# Patient Record
Sex: Female | Born: 1980 | Race: White | Hispanic: No | Marital: Married | Smoking: Never smoker
Health system: Southern US, Community
[De-identification: ages and names within clinical notes are randomized; demographics above are authoritative.]

## PROBLEM LIST (undated history)

## (undated) DIAGNOSIS — E0590 Thyrotoxicosis, unspecified without thyrotoxic crisis or storm: Secondary | ICD-10-CM

## (undated) DIAGNOSIS — O99019 Anemia complicating pregnancy, unspecified trimester: Secondary | ICD-10-CM

## (undated) DIAGNOSIS — D62 Acute posthemorrhagic anemia: Secondary | ICD-10-CM

## (undated) DIAGNOSIS — D509 Iron deficiency anemia, unspecified: Secondary | ICD-10-CM

## (undated) DIAGNOSIS — F410 Panic disorder [episodic paroxysmal anxiety] without agoraphobia: Secondary | ICD-10-CM

## (undated) DIAGNOSIS — K8020 Calculus of gallbladder without cholecystitis without obstruction: Secondary | ICD-10-CM

## (undated) HISTORY — PX: WRIST SURGERY: SHX841

---

## 2013-02-21 ENCOUNTER — Encounter (HOSPITAL_COMMUNITY): Payer: Self-pay | Admitting: *Deleted

## 2013-02-21 ENCOUNTER — Emergency Department (HOSPITAL_COMMUNITY)
Admission: EM | Admit: 2013-02-21 | Discharge: 2013-02-21 | Disposition: A | Discharge status: Home / Self Care | Payer: BC Managed Care – PPO | Attending: Emergency Medicine | Admitting: Emergency Medicine

## 2013-02-21 ENCOUNTER — Emergency Department (HOSPITAL_COMMUNITY): Payer: BC Managed Care – PPO

## 2013-02-21 DIAGNOSIS — J029 Acute pharyngitis, unspecified: Secondary | ICD-10-CM | POA: Insufficient documentation

## 2013-02-21 DIAGNOSIS — R000 Tachycardia, unspecified: Secondary | ICD-10-CM | POA: Insufficient documentation

## 2013-02-21 DIAGNOSIS — R52 Pain, unspecified: Secondary | ICD-10-CM | POA: Insufficient documentation

## 2013-02-21 DIAGNOSIS — R6889 Other general symptoms and signs: Secondary | ICD-10-CM

## 2013-02-21 DIAGNOSIS — Z8659 Personal history of other mental and behavioral disorders: Secondary | ICD-10-CM | POA: Insufficient documentation

## 2013-02-21 DIAGNOSIS — R509 Fever, unspecified: Secondary | ICD-10-CM | POA: Insufficient documentation

## 2013-02-21 HISTORY — DX: Panic disorder (episodic paroxysmal anxiety): F41.0

## 2013-02-21 LAB — URINE MICROSCOPIC-ADD ON

## 2013-02-21 LAB — URINALYSIS, ROUTINE W REFLEX MICROSCOPIC
Glucose, UA: NEGATIVE mg/dL
Nitrite: NEGATIVE
Specific Gravity, Urine: 1.018 (ref 1.005–1.030)
pH: 7.5 (ref 5.0–8.0)

## 2013-02-21 LAB — BASIC METABOLIC PANEL
BUN: 12 mg/dL (ref 6–23)
CO2: 21 mEq/L (ref 19–32)
Chloride: 98 mEq/L (ref 96–112)
Creatinine, Ser: 0.55 mg/dL (ref 0.50–1.10)
GFR calc Af Amer: >90 mL/min (ref >90)
Potassium: 3.9 mEq/L (ref 3.5–5.1)

## 2013-02-21 LAB — CBC WITH DIFFERENTIAL/PLATELET
Basophils Absolute: 0 10*3/uL (ref 0.0–0.1)
Eosinophils Relative: 1 % (ref 0–5)
HCT: 35.5 % — ABNORMAL LOW (ref 36.0–46.0)
Lymphocytes Relative: 8 % — ABNORMAL LOW (ref 12–46)
Lymphs Abs: 1.1 10*3/uL (ref 0.7–4.0)
MCV: 84.9 fL (ref 78.0–100.0)
Monocytes Relative: 8 % (ref 3–12)
Neutro Abs: 12 10*3/uL — ABNORMAL HIGH (ref 1.7–7.7)
RBC: 4.18 MIL/uL (ref 3.87–5.11)
RDW: 13.2 % (ref 11.5–15.5)
WBC: 14.3 10*3/uL — ABNORMAL HIGH (ref 4.0–10.5)

## 2013-02-21 MED ORDER — ACETAMINOPHEN 325 MG PO TABS
650.0000 mg | ORAL_TABLET | Freq: Once | ORAL | Status: AC
  Administered 2013-02-21: 650 mg via ORAL
  Filled 2013-02-21: qty 2

## 2013-02-21 MED ORDER — SODIUM CHLORIDE 0.9 % IV BOLUS (SEPSIS)
1000.0000 mL | INTRAVENOUS | Status: AC
  Administered 2013-02-21: 1000 mL via INTRAVENOUS

## 2013-02-21 MED ORDER — OSELTAMIVIR PHOSPHATE 75 MG PO CAPS: 75.0000 mg | ORAL_CAPSULE | Freq: Two times a day (BID) | ORAL | Status: DC

## 2013-02-21 MED ORDER — SODIUM CHLORIDE 0.9 % IV BOLUS (SEPSIS)
1000.0000 mL | Freq: Once | INTRAVENOUS | Status: AC
  Administered 2013-02-21: 1000 mL via INTRAVENOUS

## 2013-02-21 NOTE — ED Provider Notes (Signed)
History     CSN: [id number]  Arrival date & time 02/21/13  0115   First MD Initiated Contact with Patient 02/21/13 0130      Chief Complaint  Patient presents with  . Panic Attack    (Consider location/radiation/quality/duration/timing/severity/associated sxs/prior treatment) HPI Comments: This is a 32 year old female, past medical history remarkable for panic attacks, who presents emergency department with chief complaint of generalized body aches. Patient states that she began feeling sick today. She endorses generalized body aches, and sore throat.  She rates her discomfort at an 8/10. She denies fever, chills, cough, chest pain, SOB, nausea, vomiting, diarrhea, constipation. She has not taken anything to alleviate her symptoms. She states that her pain is moderate in severity. She is wondering if this is a panic attack, she has a history of panic attacks. She has had panic attacks before and says that this feels similar.  The history is provided by the patient. No language interpreter was used.    Past Medical History  Diagnosis Date  . Panic attacks     History reviewed. No pertinent past surgical history.  No family history on file.  History  Substance Use Topics  . Smoking status: Never Smoker   . Smokeless tobacco: Not on file  . Alcohol Use: No    OB History   Grav Para Term Preterm Abortions TAB SAB Ect Mult Living                  Review of Systems  All other systems reviewed and are negative.    Allergies  Review of patient's allergies indicates no known allergies.  Home Medications  No current outpatient prescriptions on file.  BP 110/67  Pulse 114  Temp(Src) 99 F (37.2 C)  Resp 20  SpO2 98%  LMP 02/18/2013  Physical Exam  Nursing note and vitals reviewed. Constitutional: She is oriented to person, place, and time. She appears well-developed and well-nourished.  HENT:  Head: Normocephalic and atraumatic.  Eyes: Conjunctivae and EOM are  normal. Pupils are equal, round, and reactive to light.  Neck: Normal range of motion. Neck supple.  Cardiovascular: Regular rhythm.  Exam reveals no gallop and no friction rub.   No murmur heard. Tachycardic to the 110s  Pulmonary/Chest: Effort normal and breath sounds normal. No respiratory distress. She has no wheezes. She has no rales. She exhibits no tenderness.  Abdominal: Soft. Bowel sounds are normal. She exhibits no distension and no mass. There is no tenderness. There is no rebound and no guarding.  Musculoskeletal: Normal range of motion. She exhibits no edema and no tenderness.  Neurological: She is alert and oriented to person, place, and time.  Skin: Skin is warm and dry.  Psychiatric: She has a normal mood and affect. Her behavior is normal. Judgment and thought content normal.    ED Course  Procedures (including critical care time)  Labs Reviewed  CBC WITH DIFFERENTIAL  BASIC METABOLIC PANEL   Results for orders placed during the hospital encounter of 02/21/13  CBC WITH DIFFERENTIAL      Result Value Range   WBC 14.3 (*) 4.0 - 10.5 K/uL   RBC 4.18  3.87 - 5.11 MIL/uL   Hemoglobin 12.5  12.0 - 15.0 g/dL   HCT 40.9 (*) 81.1 - 91.4 %   MCV 84.9  78.0 - 100.0 fL   MCH 29.9  26.0 - 34.0 pg   MCHC 35.2  30.0 - 36.0 g/dL   RDW 78.2  95.6 -  15.5 %   Platelets PLATELETS APPEAR DECREASED  150 - 400 K/uL   Neutrophils Relative 83 (*) 43 - 77 %   Lymphocytes Relative 8 (*) 12 - 46 %   Monocytes Relative 8  3 - 12 %   Eosinophils Relative 1  0 - 5 %   Basophils Relative 0  0 - 1 %   Neutro Abs 12.0 (*) 1.7 - 7.7 K/uL   Lymphs Abs 1.1  0.7 - 4.0 K/uL   Monocytes Absolute 1.1 (*) 0.1 - 1.0 K/uL   Eosinophils Absolute 0.1  0.0 - 0.7 K/uL   Basophils Absolute 0.0  0.0 - 0.1 K/uL   Smear Review PLATELET COUNT CONFIRMED BY SMEAR    BASIC METABOLIC PANEL      Result Value Range   Sodium 132 (*) 135 - 145 mEq/L   Potassium 3.9  3.5 - 5.1 mEq/L   Chloride 98  96 - 112 mEq/L    CO2 21  19 - 32 mEq/L   Glucose, Bld 104 (*) 70 - 99 mg/dL   BUN 12  6 - 23 mg/dL   Creatinine, Ser 4.09  0.50 - 1.10 mg/dL   Calcium 8.7  8.4 - 81.1 mg/dL   GFR calc non Af Amer >90  >90 mL/min   GFR calc Af Amer >90  >90 mL/min  URINALYSIS, ROUTINE W REFLEX MICROSCOPIC      Result Value Range   Color, Urine YELLOW  YELLOW   APPearance CLEAR  CLEAR   Specific Gravity, Urine 1.018  1.005 - 1.030   pH 7.5  5.0 - 8.0   Glucose, UA NEGATIVE  NEGATIVE mg/dL   Hgb urine dipstick MODERATE (*) NEGATIVE   Bilirubin Urine NEGATIVE  NEGATIVE   Ketones, ur NEGATIVE  NEGATIVE mg/dL   Protein, ur NEGATIVE  NEGATIVE mg/dL   Urobilinogen, UA 0.2  0.0 - 1.0 mg/dL   Nitrite NEGATIVE  NEGATIVE   Leukocytes, UA SMALL (*) NEGATIVE  URINE MICROSCOPIC-ADD ON      Result Value Range   Squamous Epithelial / LPF RARE  RARE   WBC, UA 3-6  <3 WBC/hpf   RBC / HPF 7-10  <3 RBC/hpf   Bacteria, UA RARE  RARE   Dg Chest 2 View  02/21/2013  *RADIOLOGY REPORT*  Clinical Data: Sudden onset of chest pain, shortness of breath, and anxiety.  CHEST - 2 VIEW  Comparison: None.  Findings: The heart size and pulmonary vascularity are normal. The lungs appear clear and expanded without focal air space disease or consolidation. No blunting of the costophrenic angles.  No pneumothorax.  Mediastinal contours appear intact.  IMPRESSION: No evidence of active pulmonary disease.   Original Report Authenticated By: Burman Nieves, M.D.       1. Flu-like symptoms       MDM  32 year old female with flulike illness. Will give fluids. Doubt this panic attack. Will give Tylenol for fever. Will reevaluate and check basic labs.    No diaphoresis, or SOB. Doubt PE, no coughing.  Generalized body aches with low-grade fever.  Suspect viral illness, perhaps influenza.  Patient did not get the flu shot. Will culture urine.  Return precautions given. States that she still feels achy, but that it is 2/10 after fluids and tylenol.   She states that she wants to go home.  Will discharge, recommend continued fluids, and tylenol/motrin for fever.  Discussed the patient with Dr. Norlene Campbell, who agrees with the plan.  Patient is stable  and ready for discharge.  Filed Vitals:   02/21/13 0414  BP:   Pulse: 107  Temp: 98.2 F (36.8 C)  Resp: [address] PA-C 02/21/13 0445  Roxy Horseman, PA-C 02/21/13 [phone]

## 2013-02-21 NOTE — ED Notes (Signed)
Pt also states cold like symptoms, generalized body aches.

## 2013-02-21 NOTE — ED Notes (Signed)
Pt states has a lot of stress and feels like having a panic attack; states chest feels heavy; aching all over; abd pain

## 2013-02-21 NOTE — ED Provider Notes (Signed)
Medical screening examination/treatment/procedure(s) were performed by non-physician practitioner and as supervising physician I was immediately available for consultation/collaboration.  Rivaldo Hineman M Tajai Suder, MD 02/21/13 0517 

## 2013-02-22 LAB — URINE CULTURE

## 2013-06-06 ENCOUNTER — Ambulatory Visit: Payer: BC Managed Care – PPO | Admitting: Internal Medicine

## 2013-06-06 VITALS — BP 90/54 | HR 121 | Temp 102.8°F | Resp 17 | Ht 58.5 in | Wt 121.0 lb

## 2013-06-06 DIAGNOSIS — J020 Streptococcal pharyngitis: Secondary | ICD-10-CM

## 2013-06-06 DIAGNOSIS — R109 Unspecified abdominal pain: Secondary | ICD-10-CM

## 2013-06-06 DIAGNOSIS — J039 Acute tonsillitis, unspecified: Secondary | ICD-10-CM

## 2013-06-06 DIAGNOSIS — R05 Cough: Secondary | ICD-10-CM

## 2013-06-06 DIAGNOSIS — IMO0001 Reserved for inherently not codable concepts without codable children: Secondary | ICD-10-CM

## 2013-06-06 DIAGNOSIS — M791 Myalgia, unspecified site: Secondary | ICD-10-CM

## 2013-06-06 DIAGNOSIS — J029 Acute pharyngitis, unspecified: Secondary | ICD-10-CM

## 2013-06-06 LAB — POCT CBC
Lymph, poc: 0.9 (ref 0.6–3.4)
MCH, POC: 29.4 pg (ref 27–31.2)
MCHC: 31.6 g/dL — AB (ref 31.8–35.4)
MCV: 93 fL (ref 80–97)
MID (cbc): 0.5 (ref 0–0.9)
POC LYMPH PERCENT: 6.3 %L — AB (ref 10–50)
POC MID %: 3.6 %M (ref 0–12)
Platelet Count, POC: 236 10*3/uL (ref 142–424)
RBC: 4.39 M/uL (ref 4.04–5.48)
RDW, POC: 13.9 %
WBC: 14.7 10*3/uL — AB (ref 4.6–10.2)

## 2013-06-06 LAB — POCT UA - MICROSCOPIC ONLY
Casts, Ur, LPF, POC: NEGATIVE
Mucus, UA: NEGATIVE

## 2013-06-06 LAB — POCT URINALYSIS DIPSTICK
Bilirubin, UA: NEGATIVE
Ketones, UA: 80
Leukocytes, UA: NEGATIVE
Spec Grav, UA: 1.02
pH, UA: 5.5

## 2013-06-06 MED ORDER — AMOXICILLIN 500 MG PO CAPS: 1000.0000 mg | ORAL_CAPSULE | Freq: Two times a day (BID) | ORAL | Status: DC

## 2013-06-06 MED ORDER — HYDROCODONE-ACETAMINOPHEN 7.5-325 MG/15ML PO SOLN: 5.0000 mL | Freq: Four times a day (QID) | ORAL | Status: DC | PRN

## 2013-06-06 MED ORDER — CEFTRIAXONE SODIUM 1 G IJ SOLR
1.0000 g | Freq: Once | INTRAMUSCULAR | Status: AC
  Administered 2013-06-06: 1 g via INTRAMUSCULAR

## 2013-06-06 NOTE — Progress Notes (Signed)
  Subjective:    Patient ID: Marissa Shaffer, female    DOB: 02/25/1981, 32 y.o.   MRN: [id number]  HPI    Review of Systems     Objective:   Physical Exam        Assessment & Plan:

## 2013-06-06 NOTE — Progress Notes (Signed)
Subjective:    Patient ID: Marissa Shaffer, female    DOB: 08-Aug-1981, 32 y.o.   MRN: [id number]  HPI 32 y.o female present to clinic with cold/flu like symptoms. Has had stiff neck, sore throat. Patient states she feels achey all over. Aches worse in lower back Has had headache, fever, nausea vomiting. Has vomited twice. Pateint has been pushing fluids. Symptoms started Tuesday evening. Thinks she has a uti. Very sore throat. Body aches and vomiting.  Patient worked out with a Systems analyst on Tuesday, thinks this may have made symptoms worse.  Had fever of 102.8.  Heart beating fast.   Review of Systems     Objective:   Physical Exam  Vitals reviewed. Constitutional: She is oriented to person, place, and time. She appears well-developed and well-nourished. She appears distressed.  HENT:  Right Ear: External ear normal.  Left Ear: External ear normal.  Nose: Mucosal edema, rhinorrhea and sinus tenderness present.  Mouth/Throat: Uvula is midline. Oropharyngeal exudate present.  Eyes: EOM are normal. No scleral icterus.  Neck: Normal range of motion. Neck supple.  Cardiovascular: Regular rhythm.  Tachycardia present.   No murmur heard. Pulmonary/Chest: Effort normal and breath sounds normal. She exhibits tenderness.  Abdominal: Soft. Bowel sounds are normal. There is no hepatosplenomegaly. There is tenderness in the left upper quadrant. There is CVA tenderness. There is no tenderness at McBurney's point and negative Murphy's sign.  Neurological: She is alert and oriented to person, place, and time. She has normal reflexes. No cranial nerve deficit. She exhibits normal muscle tone. Coordination normal.  Psychiatric: She has a normal mood and affect. Her behavior is normal.   Results for orders placed in visit on 06/06/13  POCT CBC      Result Value Range   WBC 14.7 (*) 4.6 - 10.2 K/uL   Lymph, poc 0.9  0.6 - 3.4   POC LYMPH PERCENT 6.3 (*) 10 - 50 %L   MID (cbc) 0.5  0 - 0.9    POC MID % 3.6  0 - 12 %M   POC Granulocyte 13.2 (*) 2 - 6.9   Granulocyte percent 90.1 (*) 37 - 80 %G   RBC 4.39  4.04 - 5.48 M/uL   Hemoglobin 12.9  12.2 - 16.2 g/dL   HCT, POC 40.9  81.1 - 47.9 %   MCV 93.0  80 - 97 fL   MCH, POC 29.4  27 - 31.2 pg   MCHC 31.6 (*) 31.8 - 35.4 g/dL   RDW, POC 91.4     Platelet Count, POC 236  142 - 424 K/uL   MPV 9.0  0 - 99.8 fL  POCT INFLUENZA A/B      Result Value Range   Influenza A, POC Negative     Influenza B, POC Negative    POCT URINALYSIS DIPSTICK      Result Value Range   Color, UA yellow     Clarity, UA cloudy     Glucose, UA neg     Bilirubin, UA neg     Ketones, UA 80     Spec Grav, UA 1.020     Blood, UA large     pH, UA 5.5     Protein, UA trace     Urobilinogen, UA 0.2     Nitrite, UA neg     Leukocytes, UA Negative    POCT UA - MICROSCOPIC ONLY      Result Value Range   WBC, Ur, HPF,  POC 1-3     RBC, urine, microscopic 2-4     Bacteria, U Microscopic trace     Mucus, UA neg     Epithelial cells, urine per micros 3-8     Crystals, Ur, HPF, POC neg     Casts, Ur, LPF, POC neg     Yeast, UA neg    POCT RAPID STREP A (OFFICE)      Result Value Range   Rapid Strep A Screen Positive (*) Negative          Assessment & Plan:  Tonsillitis/Vomiting/Cough

## 2013-06-06 NOTE — Patient Instructions (Addendum)

## 2015-06-25 LAB — OB RESULTS CONSOLE ABO/RH: RH TYPE: POSITIVE

## 2015-06-25 LAB — OB RESULTS CONSOLE HIV ANTIBODY (ROUTINE TESTING): HIV: NONREACTIVE

## 2015-06-25 LAB — OB RESULTS CONSOLE HEPATITIS B SURFACE ANTIGEN: HEP B S AG: NEGATIVE

## 2015-06-25 LAB — OB RESULTS CONSOLE RPR: RPR: NONREACTIVE

## 2015-06-25 LAB — OB RESULTS CONSOLE RUBELLA ANTIBODY, IGM: RUBELLA: NON-IMMUNE/NOT IMMUNE

## 2015-06-25 LAB — OB RESULTS CONSOLE ANTIBODY SCREEN: Antibody Screen: NEGATIVE

## 2015-06-29 ENCOUNTER — Other Ambulatory Visit (HOSPITAL_COMMUNITY): Payer: Self-pay | Admitting: Certified Nurse Midwife

## 2015-06-29 ENCOUNTER — Other Ambulatory Visit: Payer: Self-pay | Admitting: Certified Nurse Midwife

## 2015-06-29 DIAGNOSIS — R748 Abnormal levels of other serum enzymes: Secondary | ICD-10-CM

## 2015-06-29 DIAGNOSIS — R112 Nausea with vomiting, unspecified: Secondary | ICD-10-CM

## 2015-07-01 ENCOUNTER — Ambulatory Visit (HOSPITAL_COMMUNITY): Payer: Self-pay

## 2015-07-01 ENCOUNTER — Ambulatory Visit (HOSPITAL_COMMUNITY)
Admission: RE | Admit: 2015-07-01 | Discharge: 2015-07-01 | Disposition: A | Discharge status: Home / Self Care | Payer: BLUE CROSS/BLUE SHIELD | Source: Ambulatory Visit | Attending: Certified Nurse Midwife | Admitting: Certified Nurse Midwife

## 2015-07-01 DIAGNOSIS — R748 Abnormal levels of other serum enzymes: Secondary | ICD-10-CM | POA: Insufficient documentation

## 2015-07-01 DIAGNOSIS — K8020 Calculus of gallbladder without cholecystitis without obstruction: Secondary | ICD-10-CM | POA: Diagnosis not present

## 2015-07-01 DIAGNOSIS — O9989 Other specified diseases and conditions complicating pregnancy, childbirth and the puerperium: Secondary | ICD-10-CM | POA: Diagnosis not present

## 2015-07-01 DIAGNOSIS — Z3A14 14 weeks gestation of pregnancy: Secondary | ICD-10-CM | POA: Insufficient documentation

## 2015-07-01 DIAGNOSIS — R112 Nausea with vomiting, unspecified: Secondary | ICD-10-CM

## 2015-07-02 LAB — OB RESULTS CONSOLE GC/CHLAMYDIA
Chlamydia: NEGATIVE
Gonorrhea: NEGATIVE

## 2015-08-13 ENCOUNTER — Other Ambulatory Visit: Payer: Self-pay | Admitting: Certified Nurse Midwife

## 2015-08-13 DIAGNOSIS — R7989 Other specified abnormal findings of blood chemistry: Secondary | ICD-10-CM

## 2015-08-14 ENCOUNTER — Ambulatory Visit
Admission: RE | Admit: 2015-08-14 | Discharge: 2015-08-14 | Disposition: A | Discharge status: Home / Self Care | Payer: BLUE CROSS/BLUE SHIELD | Source: Ambulatory Visit | Attending: Certified Nurse Midwife | Admitting: Certified Nurse Midwife

## 2015-08-14 DIAGNOSIS — R7989 Other specified abnormal findings of blood chemistry: Secondary | ICD-10-CM

## 2015-11-19 LAB — OB RESULTS CONSOLE GBS: STREP GROUP B AG: NEGATIVE

## 2015-12-06 NOTE — L&D Delivery Note (Signed)
Delivery Note At 6:43 PM a viable and healthy female was delivered via Vaginal, Spontaneous Delivery (Presentation: Left Occiput Anterior).  APGAR: 9, 9; weight  pending.   Placenta status: spontaneous, intact.  Cord: 3 vessels with the following complications: None.  Cord pH: na  Anesthesia: None  Episiotomy: None Lacerations: 1st degree Suture Repair: 3.0 vicryl rapide Est. Blood Loss (mL): 150  Mom to postpartum.  Baby to Couplet care / Skin to Skin.  Chandra Asher J 12/16/2015, 6:59 PM

## 2015-12-16 ENCOUNTER — Inpatient Hospital Stay (HOSPITAL_COMMUNITY)
Admission: AD | Admit: 2015-12-16 | Discharge: 2015-12-18 | DRG: 775 | Disposition: A | Discharge status: Home / Self Care | Payer: BLUE CROSS/BLUE SHIELD | Source: Ambulatory Visit | Attending: Obstetrics and Gynecology | Admitting: Obstetrics and Gynecology

## 2015-12-16 ENCOUNTER — Encounter (HOSPITAL_COMMUNITY): Payer: Self-pay | Admitting: *Deleted

## 2015-12-16 DIAGNOSIS — E0580 Other thyrotoxicosis without thyrotoxic crisis or storm: Secondary | ICD-10-CM | POA: Diagnosis present

## 2015-12-16 DIAGNOSIS — Z833 Family history of diabetes mellitus: Secondary | ICD-10-CM | POA: Diagnosis not present

## 2015-12-16 DIAGNOSIS — Z3A39 39 weeks gestation of pregnancy: Secondary | ICD-10-CM | POA: Diagnosis not present

## 2015-12-16 DIAGNOSIS — O9081 Anemia of the puerperium: Secondary | ICD-10-CM | POA: Diagnosis not present

## 2015-12-16 DIAGNOSIS — O99284 Endocrine, nutritional and metabolic diseases complicating childbirth: Secondary | ICD-10-CM | POA: Diagnosis present

## 2015-12-16 DIAGNOSIS — O99280 Endocrine, nutritional and metabolic diseases complicating pregnancy, unspecified trimester: Secondary | ICD-10-CM

## 2015-12-16 DIAGNOSIS — O99019 Anemia complicating pregnancy, unspecified trimester: Secondary | ICD-10-CM

## 2015-12-16 DIAGNOSIS — D509 Iron deficiency anemia, unspecified: Secondary | ICD-10-CM | POA: Diagnosis present

## 2015-12-16 DIAGNOSIS — M791 Myalgia: Secondary | ICD-10-CM | POA: Diagnosis not present

## 2015-12-16 DIAGNOSIS — IMO0001 Reserved for inherently not codable concepts without codable children: Secondary | ICD-10-CM

## 2015-12-16 DIAGNOSIS — D62 Acute posthemorrhagic anemia: Secondary | ICD-10-CM | POA: Diagnosis not present

## 2015-12-16 DIAGNOSIS — E0590 Thyrotoxicosis, unspecified without thyrotoxic crisis or storm: Secondary | ICD-10-CM | POA: Diagnosis present

## 2015-12-16 HISTORY — DX: Acute posthemorrhagic anemia: D62

## 2015-12-16 HISTORY — DX: Iron deficiency anemia, unspecified: D50.9

## 2015-12-16 HISTORY — DX: Calculus of gallbladder without cholecystitis without obstruction: K80.20

## 2015-12-16 HISTORY — DX: Anemia complicating pregnancy, unspecified trimester: O99.019

## 2015-12-16 HISTORY — DX: Thyrotoxicosis, unspecified without thyrotoxic crisis or storm: E05.90

## 2015-12-16 LAB — CBC
HCT: 29.8 % — ABNORMAL LOW (ref 36.0–46.0)
HEMOGLOBIN: 9.5 g/dL — AB (ref 12.0–15.0)
MCH: 24.9 pg — AB (ref 26.0–34.0)
MCHC: 31.9 g/dL (ref 30.0–36.0)
MCV: 78.2 fL (ref 78.0–100.0)
PLATELETS: 277 10*3/uL (ref 150–400)
RBC: 3.81 MIL/uL — ABNORMAL LOW (ref 3.87–5.11)
RDW: 16 % — ABNORMAL HIGH (ref 11.5–15.5)
WBC: 13.4 10*3/uL — ABNORMAL HIGH (ref 4.0–10.5)

## 2015-12-16 LAB — ABO/RH: ABO/RH(D): O POS

## 2015-12-16 LAB — TYPE AND SCREEN
ABO/RH(D): O POS
Antibody Screen: NEGATIVE

## 2015-12-16 MED ORDER — OXYTOCIN 10 UNIT/ML IJ SOLN
2.5000 [IU]/h | INTRAVENOUS | Status: DC
  Filled 2015-12-16: qty 4

## 2015-12-16 MED ORDER — IBUPROFEN 600 MG PO TABS
600.0000 mg | ORAL_TABLET | Freq: Four times a day (QID) | ORAL | Status: DC
  Administered 2015-12-16 – 2015-12-18 (×6): 600 mg via ORAL
  Filled 2015-12-16 (×6): qty 1

## 2015-12-16 MED ORDER — LANOLIN HYDROUS EX OINT: TOPICAL_OINTMENT | CUTANEOUS | Status: DC | PRN

## 2015-12-16 MED ORDER — METHYLERGONOVINE MALEATE 0.2 MG/ML IJ SOLN: 0.2000 mg | INTRAMUSCULAR | Status: DC | PRN

## 2015-12-16 MED ORDER — ACETAMINOPHEN 325 MG PO TABS: 650.0000 mg | ORAL_TABLET | ORAL | Status: DC | PRN

## 2015-12-16 MED ORDER — BUTORPHANOL TARTRATE 1 MG/ML IJ SOLN
1.0000 mg | INTRAMUSCULAR | Status: DC | PRN
  Administered 2015-12-16: 1 mg via INTRAVENOUS
  Filled 2015-12-16: qty 1

## 2015-12-16 MED ORDER — ZOLPIDEM TARTRATE 5 MG PO TABS: 5.0000 mg | ORAL_TABLET | Freq: Every evening | ORAL | Status: DC | PRN

## 2015-12-16 MED ORDER — ONDANSETRON HCL 4 MG PO TABS: 4.0000 mg | ORAL_TABLET | ORAL | Status: DC | PRN

## 2015-12-16 MED ORDER — BENZOCAINE-MENTHOL 20-0.5 % EX AERO: 1.0000 "application " | INHALATION_SPRAY | CUTANEOUS | Status: DC | PRN

## 2015-12-16 MED ORDER — METHYLERGONOVINE MALEATE 0.2 MG PO TABS: 0.2000 mg | ORAL_TABLET | ORAL | Status: DC | PRN

## 2015-12-16 MED ORDER — SENNOSIDES-DOCUSATE SODIUM 8.6-50 MG PO TABS
2.0000 | ORAL_TABLET | ORAL | Status: DC
  Administered 2015-12-17 (×2): 2 via ORAL
  Filled 2015-12-16 (×2): qty 2

## 2015-12-16 MED ORDER — PRENATAL MULTIVITAMIN CH
1.0000 | ORAL_TABLET | Freq: Every day | ORAL | Status: DC
  Administered 2015-12-17: 1 via ORAL
  Filled 2015-12-16: qty 1

## 2015-12-16 MED ORDER — OXYTOCIN BOLUS FROM INFUSION
500.0000 mL | INTRAVENOUS | Status: DC
Start: 2015-12-16 — End: 2015-12-16

## 2015-12-16 MED ORDER — CITRIC ACID-SODIUM CITRATE 334-500 MG/5ML PO SOLN: 30.0000 mL | ORAL | Status: DC | PRN

## 2015-12-16 MED ORDER — DIPHENHYDRAMINE HCL 25 MG PO CAPS: 25.0000 mg | ORAL_CAPSULE | Freq: Four times a day (QID) | ORAL | Status: DC | PRN

## 2015-12-16 MED ORDER — LIDOCAINE HCL (PF) 1 % IJ SOLN
30.0000 mL | INTRAMUSCULAR | Status: DC | PRN
  Filled 2015-12-16: qty 30

## 2015-12-16 MED ORDER — ONDANSETRON HCL 4 MG/2ML IJ SOLN: 4.0000 mg | INTRAMUSCULAR | Status: DC | PRN

## 2015-12-16 MED ORDER — LACTATED RINGERS IV SOLN: 500.0000 mL | INTRAVENOUS | Status: DC | PRN

## 2015-12-16 MED ORDER — ONDANSETRON HCL 4 MG/2ML IJ SOLN: 4.0000 mg | Freq: Four times a day (QID) | INTRAMUSCULAR | Status: DC | PRN

## 2015-12-16 MED ORDER — DIBUCAINE 1 % RE OINT: 1.0000 "application " | TOPICAL_OINTMENT | RECTAL | Status: DC | PRN

## 2015-12-16 MED ORDER — TETANUS-DIPHTH-ACELL PERTUSSIS 5-2.5-18.5 LF-MCG/0.5 IM SUSP
0.5000 mL | Freq: Once | INTRAMUSCULAR | Status: AC
  Administered 2015-12-18: 0.5 mL via INTRAMUSCULAR

## 2015-12-16 MED ORDER — WITCH HAZEL-GLYCERIN EX PADS: 1.0000 "application " | MEDICATED_PAD | CUTANEOUS | Status: DC | PRN

## 2015-12-16 MED ORDER — ACETAMINOPHEN 325 MG PO TABS
650.0000 mg | ORAL_TABLET | ORAL | Status: DC | PRN
  Administered 2015-12-17: 650 mg via ORAL
  Filled 2015-12-16 (×2): qty 2

## 2015-12-16 MED ORDER — SIMETHICONE 80 MG PO CHEW: 80.0000 mg | CHEWABLE_TABLET | ORAL | Status: DC | PRN

## 2015-12-16 MED ORDER — OXYCODONE-ACETAMINOPHEN 5-325 MG PO TABS: 2.0000 | ORAL_TABLET | ORAL | Status: DC | PRN

## 2015-12-16 MED ORDER — OXYCODONE-ACETAMINOPHEN 5-325 MG PO TABS
1.0000 | ORAL_TABLET | ORAL | Status: DC | PRN
Start: 2015-12-16 — End: 2015-12-16

## 2015-12-16 MED ORDER — LACTATED RINGERS IV SOLN
INTRAVENOUS | Status: DC
  Administered 2015-12-16: 18:00:00 via INTRAVENOUS

## 2015-12-16 NOTE — H&P (Signed)
Marissa Shaffer is a 35 y.o. female presenting for contractions.  Maternal Medical History:  Reason for admission: Contractions.   Contractions: Onset was less than 1 hour ago.   Frequency: regular.   Perceived severity is moderate.    Fetal activity: Perceived fetal activity is normal.   Last perceived fetal movement was within the past hour.    Prenatal complications: no prenatal complications Prenatal Complications - Diabetes: none.    OB History    Gravida Para Term Preterm AB TAB SAB Ectopic Multiple Living   2 1        1      Past Medical History  Diagnosis Date  . Panic attacks   . Gallstones   . Hyperthyroidism    Past Surgical History  Procedure Laterality Date  . Wrist surgery     Family History: family history includes COPD in her mother; Cancer in her mother; Diabetes in her mother; Kidney disease in her father. Social History:  reports that she has never smoked. She has never used smokeless tobacco. She reports that she does not drink alcohol. Her drug history is not on file.   Prenatal Transfer Tool  Maternal Diabetes: No Genetic Screening: Normal Maternal Ultrasounds/Referrals: Normal Fetal Ultrasounds or other Referrals:  None Maternal Substance Abuse:  No Significant Maternal Medications:  None Significant Maternal Lab Results:  None Other Comments:  subclinical hyperthyroidism  Review of Systems  Constitutional: Negative.   All other systems reviewed and are negative.   Dilation: 6 Effacement (%): 100 Station: 0 Exam by:: [email or web address] Blood pressure 116/67, pulse 86, temperature 98 F (36.7 C), temperature source Oral, resp. rate 16, height 4\' 9"  (1.448 m), weight 58.968 kg (130 lb). Maternal Exam:  Uterine Assessment: Contraction strength is moderate.  Contraction frequency is regular.   Abdomen: Patient reports no abdominal tenderness. Fetal presentation: vertex  Introitus: Normal vulva. Normal vagina.  Ferning test: positive.   Nitrazine test: positive. Amniotic fluid character: meconium stained.  Pelvis: adequate for delivery.   Cervix: Cervix evaluated by digital exam.     Physical Exam  Nursing note and vitals reviewed. Constitutional: She is oriented to person, place, and time. She appears well-developed and well-nourished.  HENT:  Head: Normocephalic and atraumatic.  Neck: Normal range of motion. Neck supple.  Cardiovascular: Normal rate and regular rhythm.   Respiratory: Effort normal and breath sounds normal.  GI: Soft. Bowel sounds are normal.  Genitourinary: Vagina normal and uterus normal.  Musculoskeletal: Normal range of motion.  Neurological: She is alert and oriented to person, place, and time. She has normal reflexes.  Skin: Skin is warm and dry. No erythema.  Psychiatric: She has a normal mood and affect. Her behavior is normal.    Prenatal labs: ABO, Rh: O/Positive/-- (07/21 0000) Antibody: Negative (07/21 0000) Rubella: Nonimmune (07/21 0000) RPR: Nonreactive (07/21 0000)  HBsAg: Negative (07/21 0000)  HIV: Non-reactive (07/21 0000)  GBS: Negative (12/15 0000)   Assessment/Plan: Term IUP  Active labor Subclinical hyperthyroidism Admit IV pain meds   Marissa Shaffer J 12/16/2015, 6:18 PM

## 2015-12-16 NOTE — MAU Note (Signed)
Was checked earlier today, was 3 cm.  Had some bloody  Show. Ctx's are ~ every 5 min

## 2015-12-17 ENCOUNTER — Encounter (HOSPITAL_COMMUNITY): Payer: Self-pay | Admitting: *Deleted

## 2015-12-17 LAB — CBC
HCT: 25.8 % — ABNORMAL LOW (ref 36.0–46.0)
HEMOGLOBIN: 8.2 g/dL — AB (ref 12.0–15.0)
MCH: 24.8 pg — AB (ref 26.0–34.0)
MCHC: 31.8 g/dL (ref 30.0–36.0)
MCV: 77.9 fL — ABNORMAL LOW (ref 78.0–100.0)
PLATELETS: 278 10*3/uL (ref 150–400)
RBC: 3.31 MIL/uL — ABNORMAL LOW (ref 3.87–5.11)
RDW: 16.2 % — AB (ref 11.5–15.5)
WBC: 17.4 10*3/uL — ABNORMAL HIGH (ref 4.0–10.5)

## 2015-12-17 LAB — RPR: RPR Ser Ql: NONREACTIVE

## 2015-12-17 MED ORDER — OXYCODONE-ACETAMINOPHEN 5-325 MG PO TABS
1.0000 | ORAL_TABLET | ORAL | Status: DC | PRN
  Administered 2015-12-17 – 2015-12-18 (×4): 2 via ORAL
  Administered 2015-12-18: 1 via ORAL
  Filled 2015-12-17 (×4): qty 2
  Filled 2015-12-17: qty 1

## 2015-12-17 MED ORDER — POLYSACCHARIDE IRON COMPLEX 150 MG PO CAPS
150.0000 mg | ORAL_CAPSULE | Freq: Two times a day (BID) | ORAL | Status: DC
  Administered 2015-12-17 – 2015-12-18 (×3): 150 mg via ORAL
  Filled 2015-12-17 (×3): qty 1

## 2015-12-17 MED ORDER — CALCIUM CARBONATE ANTACID 500 MG PO CHEW
2.0000 | CHEWABLE_TABLET | Freq: Four times a day (QID) | ORAL | Status: DC | PRN
  Administered 2015-12-17: 400 mg via ORAL
  Filled 2015-12-17: qty 2

## 2015-12-17 MED ORDER — MAGNESIUM OXIDE 400 (241.3 MG) MG PO TABS
200.0000 mg | ORAL_TABLET | Freq: Every day | ORAL | Status: DC
  Administered 2015-12-17 – 2015-12-18 (×2): 200 mg via ORAL
  Filled 2015-12-17 (×2): qty 0.5

## 2015-12-17 NOTE — Progress Notes (Signed)
Patient complains of indigestion. CNM Fredric MareBailey notified, patient is requesting TUMS.

## 2015-12-17 NOTE — Lactation Note (Signed)
This note was copied from the chart of Marissa Shaffer. Lactation Consultation Note Experienced BF mom Bf her 1st child exclusively for 9 months then 3 more months for comfort. Has been 6 yrs ago. Has very large pendulum breast. Nipples appear flat, then w/stimulation everts out well. Nipple and areola very compressible for a deep latch. Mom states baby gets on then clamps down. Instructed to do gentle chin tug. Discussed positioning and obtaining deep latch. Mom has good flow of colostrum. Hand expression taught to Mom. Doing STS. Discussed pumping w/mom to obtain a supply. Discussed pumps and hand expression. Mom encouraged to feed baby 8-12 times/24 hours and with feeding cues. Referred to Baby and Me Book in Breastfeeding section Pg. 22-23 for position options and Proper latch demonstration. Educated about newborn behavior, I&O, cluster feeding, supply and demand. WH/LC brochure given w/resources, support groups and LC services. Patient Name: Marissa Shaffer [id number]'s Date: 12/17/2015 Reason for consult: Initial assessment   Maternal Data Has patient been taught Hand Expression?: Yes Does the patient have breastfeeding experience prior to this delivery?: Yes  Feeding Feeding Type: Breast Fed Length of feed: 20 min  LATCH Score/Interventions Latch: Repeated attempts needed to sustain latch, nipple held in mouth throughout feeding, stimulation needed to elicit sucking reflex.     Type of Nipple: Everted at rest and after stimulation  Comfort (Breast/Nipple): Soft / non-tender           Lactation Tools Discussed/Used     Consult Status Consult Status: Follow-up Date: 12/18/15 Follow-up type: In-patient    Hendrix Console, Diamond NickelLAURA G 12/17/2015, 5:00 AM

## 2015-12-17 NOTE — Progress Notes (Signed)
PPD #1- SVD  Subjective:   Reports feeling well Tolerating po/ No nausea or vomiting Bleeding is light Pain controlled with Motrin Up ad lib / ambulatory / voiding without problems Newborn: breastfeeding  / Circumcision: done  Objective:   VS:  VS:  Filed Vitals:   12/16/15 1931 12/16/15 2015 12/16/15 2135 12/17/15 0025  BP: 1_0 98/62  Pulse: 97 75 84 88  Temp:  98 F (36.7 C) 98.1 F (36.7 C) 98.1 F (36.7 C)  TempSrc:  Oral Oral Oral  Resp: _1 Height:      Weight:        LABS:  Recent Labs  12/16/15 1735 12/17/15 0535  WBC 13.4* 17.4*  HGB 9.5* 8.2*  PLT 277 278   Blood type: --/--/O POS, O POS (01/11 1735) Rubella: Nonimmune (07/21 0000)   I&O: Intake/Output      01/11 0701 - 01/12 0700 01/12 0701 - 01/13 0700   Blood 150    Total Output 150     Net -150            Physical Exam: Alert and oriented x3 Abdomen: soft, non-tender, non-distended  Fundus: firm, non-tender, U-2 Perineum: Well approximated, no significant erythema, edema, or drainage; healing well. Lochia: small Extremities: No edema, no calf pain or tenderness   Assessment:  PPD #1 G2P1001/ S/P:spontaneous vaginal, 1st degree laceration IDA with compounding ABL anemia Hyperthyroidism-stable Rubella non-immune  Doing well   Plan: Start Niferex MMR before discharge Continue routine post partum orders Anticipate D/C home tomorrow   Julianne Handler, N MSN, CNM 12/17/2015, 1:57 PM

## 2015-12-18 ENCOUNTER — Encounter (HOSPITAL_COMMUNITY): Payer: Self-pay | Admitting: Obstetrics and Gynecology

## 2015-12-18 DIAGNOSIS — D509 Iron deficiency anemia, unspecified: Secondary | ICD-10-CM

## 2015-12-18 DIAGNOSIS — D62 Acute posthemorrhagic anemia: Secondary | ICD-10-CM

## 2015-12-18 DIAGNOSIS — O99019 Anemia complicating pregnancy, unspecified trimester: Secondary | ICD-10-CM

## 2015-12-18 HISTORY — DX: Iron deficiency anemia, unspecified: D50.9

## 2015-12-18 HISTORY — DX: Acute posthemorrhagic anemia: D62

## 2015-12-18 MED ORDER — MEASLES, MUMPS & RUBELLA VAC ~~LOC~~ INJ
0.5000 mL | INJECTION | Freq: Once | SUBCUTANEOUS | Status: AC
  Administered 2015-12-18: 0.5 mL via SUBCUTANEOUS
  Filled 2015-12-18: qty 0.5

## 2015-12-18 MED ORDER — POLYSACCHARIDE IRON COMPLEX 150 MG PO CAPS: 150.0000 mg | ORAL_CAPSULE | Freq: Two times a day (BID) | ORAL | Status: DC

## 2015-12-18 MED ORDER — IBUPROFEN 600 MG PO TABS: 600.0000 mg | ORAL_TABLET | Freq: Four times a day (QID) | ORAL | Status: DC

## 2015-12-18 MED ORDER — MAGNESIUM OXIDE 400 (241.3 MG) MG PO TABS: 200.0000 mg | ORAL_TABLET | Freq: Every day | ORAL | Status: DC

## 2015-12-18 MED ORDER — OXYCODONE-ACETAMINOPHEN 5-325 MG PO TABS: 1.0000 | ORAL_TABLET | ORAL | Status: DC | PRN

## 2015-12-18 NOTE — Progress Notes (Signed)
Patient ID: Marissa Shaffer, female   DOB: 1981/11/02, 35 y.o.   MRN: [id number] Post Partum Day #2            Information for the patient's newborn:  Marissa Shaffer, Marissa Shaffer [[id number]][[id number]]  female   / circumcision done Feeding: breast  Subjective: No HA, SOB, CP, F/C, breast symptoms. Pain ibuprofen and Percocet. C/O pain in both thighs that radiate down legs with nursing and randomly. Normal vaginal bleeding, no clots.      Objective:  Temp:  [98.2 F (36.8 C)-98.3 F (36.8 C)] 98.3 F (36.8 C) (01/13 0624) Pulse Rate:  [72-98] 98 (01/13 0624) Resp:  [18] 18 (01/13 0624) BP: (98-105)/(65-68) 98/68 mmHg (01/13 0624) SpO2:  [99 %] 99 % (01/13 0624)     Recent Labs  12/16/15 1735 12/17/15 0535  WBC 13.4* 17.4*  HGB 9.5* 8.2*  HCT 29.8* 25.8*  PLT 277 278    Blood type: O POS (01/11 1735) Rubella: Nonimmune (07/21 0000)    Physical Exam:  General: alert, cooperative, fatigued and no distress Uterine Fundus: firm Lochia: appropriate Perineum: 1st degree repair healing well, edema none DVT Evaluation: No evidence of DVT seen on physical exam. Negative Homan's sign. No cords or calf tenderness. No significant calf/ankle edema.    Assessment/Plan: PPD # 2 / 35 y.o., G2P1001 S/P: spontaneous vaginal   Principal Problem:   Postpartum care following vaginal delivery (1/11) Active Problems:   Active labor at term   Hyperthyroidism complicating pregnancy   Iron deficiency anemia of pregnancy   Acute blood loss anemia Musculoskeletal pain   normal postpartum exam  Continue current postpartum care  Continue ibuprofen and Percocet prn  Increase water intake  D/C home   LOS: 2 days   Raelyn MoraAWSON, Jandiel Magallanes, M, MSN, CNM 12/18/2015, 8:48 AM

## 2015-12-18 NOTE — Lactation Note (Signed)
This note was copied from the chart of Marissa Shaffer. Lactation Consultation Note  Baby has anterior lingual frenulum contributing to limited tongue movement.  Baby unable to lateralize or protrude tongue fully. 6% weight loss in 28 hours. Suggest she discuss with Ped MD and provided her with resources.   Mother's L nipple has blister on tip but no soreness but R nipple is more sore.  Discussed applying ebm. Mother easily hand expressed good flow of colostrum then latched baby in football hold. Sucks and swallows observed.  Mother has large pendulous breasts.  Demonstrated how to roll towel under breast to lift. Baby having lots of gas possibly due to tongue tie.  Provided mother with hand pump and increased flange size to #27. Mother plans to get DEBP today and will start post pumping 4-6xday for 10-15 min and give volume back to baby. Attempted using #24 to help w/ soreness but due to mother's anatomy, NS was difficult to maintain and baby did not obtain enough depth w/ NS. Discussed spoon feeding for initial small amounts and milk storage. Set up OP appt for 1/20 1pm.  Praised mother for her efforts. Reviewed engorgement care and monitoring voids/stools.    Patient Name: Marissa Shaffer [id number]'s Date: 12/18/2015 Reason for consult: Follow-up assessment   Maternal Data    Feeding Feeding Type: Breast Fed Length of feed: 20 min (off and on)  LATCH Score/Interventions Latch: Grasps breast easily, tongue down, lips flanged, rhythmical sucking. Intervention(s): Adjust position;Assist with latch;Breast massage;Breast compression  Audible Swallowing: A few with stimulation Intervention(s): Hand expression  Type of Nipple: Everted at rest and after stimulation  Comfort (Breast/Nipple): Filling, red/small blisters or bruises, mild/mod discomfort  Problem noted: Cracked, bleeding, blisters, bruises Interventions  (Cracked/bleeding/bruising/blister): Hand pump;Expressed  breast milk to nipple  Hold (Positioning): No assistance needed to correctly position infant at breast.  LATCH Score: 8  Lactation Tools Discussed/Used     Consult Status Consult Status: Follow-up Date: 12/25/15 Follow-up type: Out-patient    Dahlia ByesBerkelhammer, Ruth [facility] 12/18/2015, 11:40 AM

## 2015-12-18 NOTE — Discharge Summary (Signed)
OB Discharge Summary     Patient Name: Marissa Shaffer DOB: 06/03/1981 MRN: [id number]  Date of admission: 12/16/2015 Delivering MD: Olivia MackieAAVON, RICHARD   Date of discharge: 12/18/2015  Admitting diagnosis: 39w labor Intrauterine pregnancy: 5279w0d     Secondary diagnosis:  Principal Problem:   Postpartum care following vaginal delivery (1/11) Active Problems:   Active labor at term   Hyperthyroidism complicating pregnancy   Iron deficiency anemia of pregnancy   Acute blood loss anemia  Additional problems: musculoskeletal pain (Bilateral upper extremities)     Discharge diagnosis: Term Pregnancy Delivered                                                                                                Post partum procedures:none  Augmentation: none  Complications: None  Hospital course:  Onset of Labor With Vaginal Delivery     35 y.o. yo G2P1001 at 2779w0d was admitted in Active Labor on 12/16/2015. Patient had an uncomplicated labor course as follows:  Membrane Rupture Time/Date: 6:15 PM ,12/16/2015   Intrapartum Procedures: Episiotomy: None [1]                                         Lacerations:  1st degree [2]  Patient had a delivery of a Viable infant. 12/16/2015  Information for the patient's newborn:  [name], Boy Maeby [[id number]][[id number]]  Delivery Method: Vaginal, Spontaneous Delivery (Filed from Delivery Summary)    Pateint had an uncomplicated postpartum course.  She is ambulating, tolerating a regular diet, passing flatus, and urinating well. Patient is discharged home in stable condition on 12/18/2015.    Physical exam  Filed Vitals:   12/16/15 2135 12/17/15 0025 12/17/15 1825 12/18/15 0624  BP: 99/55 98/62 105/65 98/68  Pulse: 84 88 72 98  Temp: 98.1 F (36.7 C) 98.1 F (36.7 C) 98.2 F (36.8 C) 98.3 F (36.8 C)  TempSrc: Oral Oral Oral Oral  Resp: 18 16 18 18   Height:      Weight:      SpO2:    99%   General: alert, cooperative and no distress Lochia:  appropriate Uterine Fundus: firm Perieneum: 1st degree healing well with no significant drainage, No significant erythema, Dressing is clean, dry, and intact DVT Evaluation: No evidence of DVT seen on physical exam. Negative Homan's sign. No cords or calf tenderness. No significant calf/ankle edema. Labs: Lab Results  Component Value Date   WBC 17.4* 12/17/2015   HGB 8.2* 12/17/2015   HCT 25.8* 12/17/2015   MCV 77.9* 12/17/2015   PLT 278 12/17/2015   CMP Latest Ref Rng 02/21/2013  Glucose 70 - 99 mg/dL 086(V104(H)  BUN 6 - 23 mg/dL 12  Creatinine 7.840.50 - 6.961.10 mg/dL 2.950.55  Sodium 284135 - 132145 mEq/L 132(L)  Potassium 3.5 - 5.1 mEq/L 3.9  Chloride 96 - 112 mEq/L 98  CO2 19 - 32 mEq/L 21  Calcium 8.4 - 10.5 mg/dL 8.7    Discharge instruction: per After Visit Summary and "Baby and Me  Booklet".  After visit meds:    Medication List    STOP taking these medications        IRON PO      TAKE these medications        acetaminophen 500 MG tablet  Commonly known as:  TYLENOL  Take 500 mg by mouth every 6 (six) hours as needed for moderate pain.     ibuprofen 600 MG tablet  Commonly known as:  ADVIL,MOTRIN  Take 1 tablet (600 mg total) by mouth every 6 (six) hours.     iron polysaccharides 150 MG capsule  Commonly known as:  NIFEREX  Take 1 capsule (150 mg total) by mouth 2 (two) times daily.     magnesium oxide 400 (241.3 Mg) MG tablet  Commonly known as:  MAG-OX  Take 0.5 tablets (200 mg total) by mouth daily.     oxyCODONE-acetaminophen 5-325 MG tablet  Commonly known as:  PERCOCET/ROXICET  Take 1-2 tablets by mouth every 4 (four) hours as needed for moderate pain.     prenatal multivitamin Tabs tablet  Take 1 tablet by mouth daily at 12 noon.     ranitidine 150 MG tablet  Commonly known as:  ZANTAC  Take 150 mg by mouth 3 (three) times daily as needed for heartburn.        Diet: routine diet  Activity: Advance as tolerated. Pelvic rest for 6 weeks.   Outpatient  follow up:6 weeks Follow up Appt:No future appointments. Follow up Visit:No Follow-up on file.  Postpartum contraception: Undecided  Newborn Data: Live born female on 12/16/2015 Birth Weight: 6 lb 7.9 oz (2946 g) APGAR: 9, 9  Baby Feeding: Breast Disposition:home with mother   12/18/2015 Raelyn Mora, Judie Petit, CNM

## 2015-12-18 NOTE — Progress Notes (Signed)
MOB was referred for history of depression/anxiety.  Referral is screened out by Clinical Social Worker because none of the following criteria appear to apply: -History of anxiety/depression during this pregnancy, or of post-partum depression. - Diagnosis of anxiety and/or depression within last 3 years or -MOB's symptoms are currently being treated with medication and/or therapy.  Per chart review, history of anxiety and panic attacks occurred in 2014, and is no longer listed as a current problem.   Please contact the Clinical Social Worker if needs arise or upon MOB request.

## 2015-12-18 NOTE — Discharge Instructions (Signed)
Breast Pumping Tips °If you are breastfeeding, there may be times when you cannot feed your baby directly. Returning to work or going on a trip are common examples. Pumping allows you to store breast milk and feed it to your baby later.  °You may not get much milk when you first start to pump. Your breasts should start to make more after a few days. If you pump at the times you usually feed your baby, you may be able to keep making enough milk to feed your baby without also using formula. The more often you pump, the more milk you will produce.  °WHEN SHOULD I PUMP?  °· You can begin to pump soon after delivery. However, some experts recommend waiting about 4 weeks before giving your infant a bottle to make sure breastfeeding is going well.  °· If you plan to return to work, begin pumping a few weeks before. This will help you develop techniques that work best for you. It also lets you build up a supply of breast milk.   °· When you are with your infant, feed on demand and pump after each feeding.   °· When you are away from your infant for several hours, pump for about 15 minutes every 2-3 hours. Pump both breasts at the same time if you can.   °· If your infant has a formula feeding, make sure to pump around the same time.     °· If you drink any alcohol, wait 2 hours before pumping.   °HOW DO I PREPARE TO PUMP? °Your let-down reflex is the natural reaction to stimulation that makes your breast milk flow. It is easier to stimulate this reflex when you are relaxed. Find relaxation techniques that work for you. If you have difficulty with your let-down reflex, try these methods:  °· Smell one of your infant's blankets or an item of clothing.   °· Look at a picture or video of your infant.   °· Sit in a quiet, private space.   °· Massage the breast you plan to pump.   °· Place soothing warmth on the breast.   °· Play relaxing music.   °WHAT ARE SOME GENERAL BREAST PUMPING TIPS? °· Wash your hands before you pump. You  do not need to wash your nipples or breasts. °· There are three ways to pump. °¨ You can use your hand to massage and compress your breast. °¨ You can use a handheld manual pump. °¨ You can use an electric pump.   °· Make sure the suction cup (flange) on the breast pump is the right size. Place the flange directly over the nipple. If it is the wrong size or placed the wrong way, it may be painful and cause nipple damage.   °· If pumping is uncomfortable, apply a small amount of purified or modified lanolin to your nipple and areola. °· If you are using an electric pump, adjust the speed and suction power to be more comfortable. °· If pumping is painful or if you are not getting very much milk, you may need a different type of pump. A lactation consultant can help you determine what type of pump to use.   °· Keep a full water bottle near you at all times. Drinking lots of fluid helps you make more milk.  °· You can store your milk to use later. Pumped breast milk can be stored in a sealable, sterile container or plastic bag. Label all stored breast milk with the date you pumped it. °¨ Milk can stay out at room temperature for up to 8 hours. °¨   You can store your milk in the refrigerator for up to 8 days. °¨ You can store your milk in the freezer for 3 months. Thaw frozen milk using warm water. Do not put it in the microwave. °· Do not smoke. Smoking can lower your milk supply and harm your infant. If you need help quitting, ask your health care provider to recommend a program.   °WHEN SHOULD I CALL MY HEALTH CARE PROVIDER OR A LACTATION CONSULTANT? °· You are having trouble pumping. °· You are concerned that you are not making enough milk. °· You have nipple pain, soreness, or redness. °· You want to use birth control. Birth control pills may lower your milk supply. Talk to your health care provider about your options. °  °This information is not intended to replace advice given to you by your health care provider.  Make sure you discuss any questions you have with your health care provider. °  °Document Released: 05/11/2010 Document Revised: 11/26/2013 Document Reviewed: 09/13/2013 °[employer]vier Interactive Patient Education ©2016 [employer] °Postpartum Depression and Baby Blues °The postpartum period begins right after the birth of a baby. During this time, there is often a great amount of joy and excitement. It is also a time of many changes in the life of the parents. Regardless of how many times a mother gives birth, each child brings new challenges and dynamics to the family. It is not unusual to have feelings of excitement along with confusing shifts in moods, emotions, and thoughts. All mothers are at risk of developing postpartum depression or the "baby blues." These mood changes can occur right after giving birth, or they may occur many months after giving birth. The baby blues or postpartum depression can be mild or severe. Additionally, postpartum depression can go away rather quickly, or it can be a long-term condition.  °CAUSES °Raised hormone levels and the rapid drop in those levels are thought to be a main cause of postpartum depression and the baby blues. A number of hormones change during and after pregnancy. Estrogen and progesterone usually decrease right after the delivery of your baby. The levels of thyroid hormone and various cortisol steroids also rapidly drop. Other factors that play a role in these mood changes include major life events and genetics.  °RISK FACTORS °If you have any of the following risks for the baby blues or postpartum depression, know what symptoms to watch out for during the postpartum period. Risk factors that may increase the likelihood of getting the baby blues or postpartum depression include: °· Having a personal or family history of depression.   °· Having depression while being pregnant.   °· Having premenstrual mood issues or mood issues related to oral  contraceptives. °· Having a lot of life stress.   °· Having marital conflict.   °· Lacking a social support network.   °· Having a baby with special needs.   °· Having health problems, such as diabetes.   °SIGNS AND SYMPTOMS °Symptoms of baby blues include: °· Brief changes in mood, such as going from extreme happiness to sadness. °· Decreased concentration.   °· Difficulty sleeping.   °· Crying spells, tearfulness.   °· Irritability.   °· Anxiety.   °Symptoms of postpartum depression typically begin within the first month after giving birth. These symptoms include: °· Difficulty sleeping or excessive sleepiness.   °· Marked weight loss.   °· Agitation.   °· Feelings of worthlessness.   °· Lack of interest in activity or food.   °Postpartum psychosis is a very serious condition and can be dangerous. Fortunately, it is   rare. Displaying any of the following symptoms is cause for immediate medical attention. Symptoms of postpartum psychosis include:  °· Hallucinations and delusions.   °· Bizarre or disorganized behavior.   °· Confusion or disorientation.   °DIAGNOSIS  °A diagnosis is made by an evaluation of your symptoms. There are no medical or lab tests that lead to a diagnosis, but there are various questionnaires that a health care provider may use to identify those with the baby blues, postpartum depression, or psychosis. Often, a screening tool called the Edinburgh Postnatal Depression Scale is used to diagnose depression in the postpartum period.  °TREATMENT °The baby blues usually goes away on its own in 1-2 weeks. Social support is often all that is needed. You will be encouraged to get adequate sleep and rest. Occasionally, you may be given medicines to help you sleep.  °Postpartum depression requires treatment because it can last several months or longer if it is not treated. Treatment may include individual or group therapy, medicine, or both to address any social, physiological, and psychological factors  that may play a role in the depression. Regular exercise, a healthy diet, rest, and social support may also be strongly recommended.  °Postpartum psychosis is more serious and needs treatment right away. Hospitalization is often needed. °HOME CARE INSTRUCTIONS °· Get as much rest as you can. Nap when the baby sleeps.   °· Exercise regularly. Some women find yoga and walking to be beneficial.   °· Eat a balanced and nourishing diet.   °· Do little things that you enjoy. Have a cup of tea, take a bubble bath, read your favorite magazine, or listen to your favorite music. °· Avoid alcohol.   °· Ask for help with household chores, cooking, grocery shopping, or running errands as needed. Do not try to do everything.   °· Talk to people close to you about how you are feeling. Get support from your partner, family members, friends, or other new moms. °· Try to stay positive in how you think. Think about the things you are grateful for.   °· Do not spend a lot of time alone.   °· Only take over-the-counter or prescription medicine as directed by your health care provider. °· Keep all your postpartum appointments.   °· Let your health care provider know if you have any concerns.   °SEEK MEDICAL CARE IF: °You are having a reaction to or problems with your medicine. °SEEK IMMEDIATE MEDICAL CARE IF: °· You have suicidal feelings.   °· You think you may harm the baby or someone else. °MAKE SURE YOU: °· Understand these instructions. °· Will watch your condition. °· Will get help right away if you are not doing well or get worse. °  °This information is not intended to replace advice given to you by your health care provider. Make sure you discuss any questions you have with your health care provider. °  °Document Released: 08/25/2004 Document Revised: 11/26/2013 Document Reviewed: 09/02/2013 °Elsevier Interactive Patient Education ©2016 [employer] °Postpartum Care After Vaginal Delivery °After you deliver your newborn  (postpartum period), the usual stay in the hospital is 24-72 hours. If there were problems with your labor or delivery, or if you have other medical problems, you might be in the hospital longer.  °While you are in the hospital, you will receive help and instructions on how to care for yourself and your newborn during the postpartum period.  °While you are in the hospital: °· Be sure to tell your nurses if you have pain or discomfort, as well as   where you feel the pain and what makes the pain worse. °· If you had an incision made near your vagina (episiotomy) or if you had some tearing during delivery, the nurses may put ice packs on your episiotomy or tear. The ice packs may help to reduce the pain and swelling. °· If you are breastfeeding, you may feel uncomfortable contractions of your uterus for a couple of weeks. This is normal. The contractions help your uterus get back to normal size. °· It is normal to have some bleeding after delivery. °¨ For the first 1-3 days after delivery, the flow is red and the amount may be similar to a period. °¨ It is common for the flow to start and stop. °¨ In the first few days, you may pass some small clots. Let your nurses know if you begin to pass large clots or your flow increases. °¨ Do not  flush blood clots down the toilet before having the nurse look at them. °¨ During the next 3-10 days after delivery, your flow should become more watery and pink or brown-tinged in color. °¨ Ten to fourteen days after delivery, your flow should be a small amount of yellowish-white discharge. °¨ The amount of your flow will decrease over the first few weeks after delivery. Your flow may stop in 6-8 weeks. Most women have had their flow stop by 12 weeks after delivery. °· You should change your sanitary pads frequently. °· Wash your hands thoroughly with soap and water for at least 20 seconds after changing pads, using the toilet, or before holding or feeding your newborn. °· You should  feel like you need to empty your bladder within the first 6-8 hours after delivery. °· In case you become weak, lightheaded, or faint, call your nurse before you get out of bed for the first time and before you take a shower for the first time. °· Within the first few days after delivery, your breasts may begin to feel tender and full. This is called engorgement. Breast tenderness usually goes away within 48-72 hours after engorgement occurs. You may also notice milk leaking from your breasts. If you are not breastfeeding, do not stimulate your breasts. Breast stimulation can make your breasts produce more milk. °· Spending as much time as possible with your newborn is very important. During this time, you and your newborn can feel close and get to know each other. Having your newborn stay in your room (rooming in) will help to strengthen the bond with your newborn.  It will give you time to get to know your newborn and become comfortable caring for your newborn. °· Your hormones change after delivery. Sometimes the hormone changes can temporarily cause you to feel sad or tearful. These feelings should not last more than a few days. If these feelings last longer than that, you should talk to your caregiver. °· If desired, talk to your caregiver about methods of family planning or contraception. °· Talk to your caregiver about immunizations. Your caregiver may want you to have the following immunizations before leaving the hospital: °¨ Tetanus, diphtheria, and pertussis (Tdap) or tetanus and diphtheria (Td) immunization. It is very important that you and your family (including grandparents) or others caring for your newborn are up-to-date with the Tdap or Td immunizations. The Tdap or Td immunization can help protect your newborn from getting ill. °¨ Rubella immunization. °¨ Varicella (chickenpox) immunization. °¨ Influenza immunization. You should receive this annual immunization if you did not receive the    immunization during your pregnancy. °  °This information is not intended to replace advice given to you by your health care provider. Make sure you discuss any questions you have with your health care provider. °  °Document Released: 09/18/2007 Document Revised: 08/15/2012 Document Reviewed: 07/18/2012 °[employer]vier Interactive Patient Education ©2016 [employer] °Breastfeeding and Mastitis °Mastitis is inflammation of the breast tissue. It can occur in women who are breastfeeding. This can make breastfeeding painful. Mastitis will sometimes go away on its own. Your health care provider will help determine if treatment is needed. °CAUSES °Mastitis is often associated with a blocked milk (lactiferous) duct. This can happen when too much milk builds up in the breast. Causes of excess milk in the breast can include: °· Poor latch-on. If your baby is not latched onto the breast properly, she or he may not empty your breast completely while breastfeeding. °· Allowing too much time to pass between feedings. °· Wearing a bra or other clothing that is too tight. This puts extra pressure on the lactiferous ducts so milk does not flow through them as it should. °Mastitis can also be caused by a bacterial infection. Bacteria may enter the breast tissue through cuts or openings in the skin. In women who are breastfeeding, this may occur because of cracked or irritated skin. Cracks in the skin are often caused when your baby does not latch on properly to the breast. °SIGNS AND SYMPTOMS °· Swelling, redness, tenderness, and pain in an area of the breast. °· Swelling of the glands under the arm on the same side. °· Fever may or may not accompany mastitis. °If an infection is allowed to progress, a collection of pus (abscess) may develop. °DIAGNOSIS  °Your health care provider can usually diagnose mastitis based on your symptoms and a physical exam. Tests may be done to help confirm the diagnosis. These may include: °· Removal of pus  from the breast by applying pressure to the area. This pus can be examined in the lab to determine which bacteria are present. If an abscess has developed, the fluid in the abscess can be removed with a needle. This can also be used to confirm the diagnosis and determine the bacteria present. In most cases, pus will not be present. °· Blood tests to determine if your body is fighting a bacterial infection. °· Mammogram or ultrasound tests to rule out other problems or diseases. °TREATMENT  °Mastitis that occurs with breastfeeding will sometimes go away on its own. Your health care provider may choose to wait 24 hours after first seeing you to decide whether a prescription medicine is needed. If your symptoms are worse after 24 hours, your health care provider will likely prescribe an antibiotic medicine to treat the mastitis. He or she will determine which bacteria are most likely causing the infection and will then select an appropriate antibiotic medicine. This is sometimes changed based on the results of tests performed to identify the bacteria, or if there is no response to the antibiotic medicine selected. Antibiotic medicines are usually given by mouth. You may also be given medicine for pain. °HOME CARE INSTRUCTIONS °· Only take over-the-counter or prescription medicines for pain, fever, or discomfort as directed by your health care provider. °· If your health care provider prescribed an antibiotic medicine, take the medicine as directed. Make sure you finish it even if you start to feel better. °· Do not wear a tight or underwire bra. Wear a soft, supportive bra. °· Increase your fluid   intake, especially if you have a fever. °· Continue to empty the breast. Your health care provider can tell you whether this milk is safe for your infant or needs to be thrown out. You may be told to stop nursing until your health care provider thinks it is safe for your baby. Use a breast pump if you are advised to stop  nursing. °· Keep your nipples clean and dry. °· Empty the first breast completely before going to the other breast. If your baby is not emptying your breasts completely for some reason, use a breast pump to empty your breasts. °· If you go back to work, pump your breasts while at work to stay in time with your nursing schedule. °· Avoid allowing your breasts to become overly filled with milk (engorged). °SEEK MEDICAL CARE IF: °· You have pus-like discharge from the breast. °· Your symptoms do not improve with the treatment prescribed by your health care provider within 2 days. °SEEK IMMEDIATE MEDICAL CARE IF: °· Your pain and swelling are getting worse. °· You have pain that is not controlled with medicine. °· You have a red line extending from the breast toward your armpit. °· You have a fever or persistent symptoms for more than 2-3 days. °· You have a fever and your symptoms suddenly get worse. °MAKE SURE YOU:  °· Understand these instructions. °· Will watch your condition. °· Will get help right away if you are not doing well or get worse. °  °This information is not intended to replace advice given to you by your health care provider. Make sure you discuss any questions you have with your health care provider. °  °Document Released: 03/18/2005 Document Revised: 11/26/2013 Document Reviewed: 06/27/2013 °[employer]vier Interactive Patient Education ©2016 [employer] ° °Breastfeeding °Deciding to breastfeed is one of the best choices you can make for you and your baby. A change in hormones during pregnancy causes your breast tissue to grow and increases the number and size of your milk ducts. These hormones also allow proteins, sugars, and fats from your blood supply to make breast milk in your milk-producing glands. Hormones prevent breast milk from being released before your baby is born as well as prompt milk flow after birth. Once breastfeeding has begun, thoughts of your baby, as well as his or her sucking or  crying, can stimulate the release of milk from your milk-producing glands.  °BENEFITS OF BREASTFEEDING °For Your Baby °· Your first milk (colostrum) helps your baby's digestive system function better. °· There are antibodies in your milk that help your baby fight off infections. °· Your baby has a lower incidence of asthma, allergies, and sudden infant death syndrome. °· The nutrients in breast milk are better for your baby than infant formulas and are designed uniquely for your baby's needs. °· Breast milk improves your baby's brain development. °· Your baby is less likely to develop other conditions, such as childhood obesity, asthma, or type 2 diabetes mellitus. °For You °· Breastfeeding helps to create a very special bond between you and your baby. °· Breastfeeding is convenient. Breast milk is always available at the correct temperature and costs nothing. °· Breastfeeding helps to burn calories and helps you lose the weight gained during pregnancy. °· Breastfeeding makes your uterus contract to its prepregnancy size faster and slows bleeding (lochia) after you give birth.   °· Breastfeeding helps to lower your risk of developing type 2 diabetes mellitus, osteoporosis, and breast or ovarian cancer later in life. °  SIGNS THAT YOUR BABY IS HUNGRY °Early Signs of Hunger °· Increased alertness or activity. °· Stretching. °· Movement of the head from side to side. °· Movement of the head and opening of the mouth when the corner of the mouth or cheek is stroked (rooting). °· Increased sucking sounds, smacking lips, cooing, sighing, or squeaking. °· Hand-to-mouth movements. °· Increased sucking of fingers or hands. °Late Signs of Hunger °· Fussing. °· Intermittent crying. °Extreme Signs of Hunger °Signs of extreme hunger will require calming and consoling before your baby will be able to breastfeed successfully. Do not wait for the following signs of extreme hunger to occur before you initiate  breastfeeding: °· Restlessness. °· A loud, strong cry. °· Screaming. °BREASTFEEDING BASICS °Breastfeeding Initiation °· Find a comfortable place to sit or lie down, with your neck and back well supported. °· Place a pillow or rolled up blanket under your baby to bring him or her to the level of your breast (if you are seated). Nursing pillows are specially designed to help support your arms and your baby while you breastfeed. °· Make sure that your baby's abdomen is facing your abdomen. °· Gently massage your breast. With your fingertips, massage from your chest wall toward your nipple in a circular motion. This encourages milk flow. You may need to continue this action during the feeding if your milk flows slowly. °· Support your breast with 4 fingers underneath and your thumb above your nipple. Make sure your fingers are well away from your nipple and your baby's mouth. °· Stroke your baby's lips gently with your finger or nipple. °· When your baby's mouth is open wide enough, quickly bring your baby to your breast, placing your entire nipple and as much of the colored area around your nipple (areola) as possible into your baby's mouth. °¨ More areola should be visible above your baby's upper lip than below the lower lip. °¨ Your baby's tongue should be between his or her lower gum and your breast. °· Ensure that your baby's mouth is correctly positioned around your nipple (latched). Your baby's lips should create a seal on your breast and be turned out (everted). °· It is common for your baby to suck about 2-3 minutes in order to start the flow of breast milk. °Latching °Teaching your baby how to latch on to your breast properly is very important. An improper latch can cause nipple pain and decreased milk supply for you and poor weight gain in your baby. Also, if your baby is not latched onto your nipple properly, he or she may swallow some air during feeding. This can make your baby fussy. Burping your baby when  you switch breasts during the feeding can help to get rid of the air. However, teaching your baby to latch on properly is still the best way to prevent fussiness from swallowing air while breastfeeding. °Signs that your baby has successfully latched on to your nipple: °· Silent tugging or silent sucking, without causing you pain. °· Swallowing heard between every 3-4 sucks. °· Muscle movement above and in front of his or her ears while sucking. °Signs that your baby has not successfully latched on to nipple: °· Sucking sounds or smacking sounds from your baby while breastfeeding. °· Nipple pain. °If you think your baby has not latched on correctly, slip your finger into the corner of your baby's mouth to break the suction and place it between your baby's gums. Attempt breastfeeding initiation again. °Signs of Successful Breastfeeding °  Signs from your baby: °· A gradual decrease in the number of sucks or complete cessation of sucking. °· Falling asleep. °· Relaxation of his or her body. °· Retention of a small amount of milk in his or her mouth. °· Letting go of your breast by himself or herself. °Signs from you: °· Breasts that have increased in firmness, weight, and size 1-3 hours after feeding. °· Breasts that are softer immediately after breastfeeding. °· Increased milk volume, as well as a change in milk consistency and color by the fifth day of breastfeeding. °· Nipples that are not sore, cracked, or bleeding. °Signs That Your Baby is Getting Enough Milk °· Wetting at least 3 diapers in a 24-hour period. The urine should be clear and pale yellow by age 5 days. °· At least 3 stools in a 24-hour period by age 5 days. The stool should be soft and yellow. °· At least 3 stools in a 24-hour period by age 7 days. The stool should be seedy and yellow. °· No loss of weight greater than 10% of birth weight during the first 3 days of age. °· Average weight gain of 4-7 ounces (113-198 g) per week after age 4  days. °· Consistent daily weight gain by age 5 days, without weight loss after the age of 2 weeks. °After a feeding, your baby may spit up a small amount. This is common. °BREASTFEEDING FREQUENCY AND DURATION °Frequent feeding will help you make more milk and can prevent sore nipples and breast engorgement. Breastfeed when you feel the need to reduce the fullness of your breasts or when your baby shows signs of hunger. This is called "breastfeeding on demand." Avoid introducing a pacifier to your baby while you are working to establish breastfeeding (the first 4-6 weeks after your baby is born). After this time you may choose to use a pacifier. Research has shown that pacifier use during the first year of a baby's life decreases the risk of sudden infant death syndrome (SIDS). °Allow your baby to feed on each breast as long as he or she wants. Breastfeed until your baby is finished feeding. When your baby unlatches or falls asleep while feeding from the first breast, offer the second breast. Because newborns are often sleepy in the first few weeks of life, you may need to awaken your baby to get him or her to feed. °Breastfeeding times will vary from baby to baby. However, the following rules can serve as a guide to help you ensure that your baby is properly fed: °· Newborns (babies 4 weeks of age or younger) may breastfeed every 1-3 hours. °· Newborns should not go longer than 3 hours during the day or 5 hours during the night without breastfeeding. °· You should breastfeed your baby a minimum of 8 times in a 24-hour period until you begin to introduce solid foods to your baby at around 6 months of age. °BREAST MILK PUMPING °Pumping and storing breast milk allows you to ensure that your baby is exclusively fed your breast milk, even at times when you are unable to breastfeed. This is especially important if you are going back to work while you are still breastfeeding or when you are not able to be present during  feedings. Your lactation consultant can give you guidelines on how long it is safe to store breast milk. °A breast pump is a machine that allows you to pump milk from your breast into a sterile bottle. The pumped breast milk can   then be stored in a refrigerator or freezer. Some breast pumps are operated by hand, while others use electricity. Ask your lactation consultant which type will work best for you. Breast pumps can be purchased, but some hospitals and breastfeeding support groups lease breast pumps on a monthly basis. A lactation consultant can teach you how to hand express breast milk, if you prefer not to use a pump. °CARING FOR YOUR BREASTS WHILE YOU BREASTFEED °Nipples can become dry, cracked, and sore while breastfeeding. The following recommendations can help keep your breasts moisturized and healthy: °· Avoid using soap on your nipples. °· Wear a supportive bra. Although not required, special nursing bras and tank tops are designed to allow access to your breasts for breastfeeding without taking off your entire bra or top. Avoid wearing underwire-style bras or extremely tight bras. °· Air dry your nipples for 3-4 minutes after each feeding. °· Use only cotton bra pads to absorb leaked breast milk. Leaking of breast milk between feedings is normal. °· Use lanolin on your nipples after breastfeeding. Lanolin helps to maintain your skin's normal moisture barrier. If you use pure lanolin, you do not need to wash it off before feeding your baby again. Pure lanolin is not toxic to your baby. You may also hand express a few drops of breast milk and gently massage that milk into your nipples and allow the milk to air dry. °In the first few weeks after giving birth, some women experience extremely full breasts (engorgement). Engorgement can make your breasts feel heavy, warm, and tender to the touch. Engorgement peaks within 3-5 days after you give birth. The following recommendations can help ease  engorgement: °· Completely empty your breasts while breastfeeding or pumping. You may want to start by applying warm, moist heat (in the shower or with warm water-soaked hand towels) just before feeding or pumping. This increases circulation and helps the milk flow. If your baby does not completely empty your breasts while breastfeeding, pump any extra milk after he or she is finished. °· Wear a snug bra (nursing or regular) or tank top for 1-2 days to signal your body to slightly decrease milk production. °· Apply ice packs to your breasts, unless this is too uncomfortable for you. °· Make sure that your baby is latched on and positioned properly while breastfeeding. °If engorgement persists after 48 hours of following these recommendations, contact your health care provider or a lactation consultant. °OVERALL HEALTH CARE RECOMMENDATIONS WHILE BREASTFEEDING °· Eat healthy foods. Alternate between meals and snacks, eating 3 of each per day. Because what you eat affects your breast milk, some of the foods may make your baby more irritable than usual. Avoid eating these foods if you are sure that they are negatively affecting your baby. °· Drink milk, fruit juice, and water to satisfy your thirst (about 10 glasses a day). °· Rest often, relax, and continue to take your prenatal vitamins to prevent fatigue, stress, and anemia. °· Continue breast self-awareness checks. °· Avoid chewing and smoking tobacco. Chemicals from cigarettes that pass into breast milk and exposure to secondhand smoke may harm your baby. °· Avoid alcohol and drug use, including marijuana. °Some medicines that may be harmful to your baby can pass through breast milk. It is important to ask your health care provider before taking any medicine, including all over-the-counter and prescription medicine as well as vitamin and herbal supplements. °It is possible to become pregnant while breastfeeding. If birth control is desired, ask your health care    provider about options that will be safe for your baby. °SEEK MEDICAL CARE IF: °· You feel like you want to stop breastfeeding or have become frustrated with breastfeeding. °· You have painful breasts or nipples. °· Your nipples are cracked or bleeding. °· Your breasts are red, tender, or warm. °· You have a swollen area on either breast. °· You have a fever or chills. °· You have nausea or vomiting. °· You have drainage other than breast milk from your nipples. °· Your breasts do not become full before feedings by the fifth day after you give birth. °· You feel sad and depressed. °· Your baby is too sleepy to eat well. °· Your baby is having trouble sleeping.   °· Your baby is wetting less than 3 diapers in a 24-hour period. °· Your baby has less than 3 stools in a 24-hour period. °· Your baby's skin or the white part of his or her eyes becomes yellow.   °· Your baby is not gaining weight by 5 days of age. °SEEK IMMEDIATE MEDICAL CARE IF: °· Your baby is overly tired (lethargic) and does not want to wake up and feed. °· Your baby develops an unexplained fever. °  °This information is not intended to replace advice given to you by your health care provider. Make sure you discuss any questions you have with your health care provider. °  °Document Released: 11/21/2005 Document Revised: 08/12/2015 Document Reviewed: 05/15/2013 °Elsevier Interactive Patient Education ©2016 [employer]vier Inc. ° °

## 2015-12-25 ENCOUNTER — Ambulatory Visit (HOSPITAL_COMMUNITY): Payer: BLUE CROSS/BLUE SHIELD

## 2016-01-01 ENCOUNTER — Ambulatory Visit (HOSPITAL_COMMUNITY): Payer: BLUE CROSS/BLUE SHIELD

## 2016-01-05 ENCOUNTER — Ambulatory Visit (INDEPENDENT_AMBULATORY_CARE_PROVIDER_SITE_OTHER): Payer: BLUE CROSS/BLUE SHIELD | Admitting: Internal Medicine

## 2016-01-05 ENCOUNTER — Ambulatory Visit (INDEPENDENT_AMBULATORY_CARE_PROVIDER_SITE_OTHER): Payer: BLUE CROSS/BLUE SHIELD

## 2016-01-05 VITALS — BP 118/76 | HR 93 | Temp 98.1°F | Resp 16 | Ht <= 58 in | Wt 116.0 lb

## 2016-01-05 DIAGNOSIS — M25531 Pain in right wrist: Secondary | ICD-10-CM | POA: Diagnosis not present

## 2016-01-05 DIAGNOSIS — M79601 Pain in right arm: Secondary | ICD-10-CM

## 2016-01-05 MED ORDER — IBUPROFEN 600 MG PO TABS: 600.0000 mg | ORAL_TABLET | Freq: Four times a day (QID) | ORAL | Status: DC

## 2016-01-05 MED ORDER — IBUPROFEN 600 MG PO TABS: 600.0000 mg | ORAL_TABLET | Freq: Three times a day (TID) | ORAL | Status: DC | PRN

## 2016-01-05 NOTE — Progress Notes (Signed)
Subjective:  By signing my name below, I, Essence Howell, attest that this documentation has been prepared under the direction and in the presence of Tonye Pearson, MD Electronically Signed: Charline Bills, ED Scribe 01/05/2016 at 5:44 PM.   Patient ID: Marissa Shaffer, female    DOB: 04-13-1981, 35 y.o.   MRN: [id number]  Chief Complaint  Patient presents with  . Wrist Injury    x 3 days  . skin issue    on back, x 1 year    HPI HPI Comments: Marissa Shaffer is a 35 y.o. female who presents to the [facility] complaining of a wrist injury sustained 3 days ago. Pt states that she was walking down stairs when she rolled her left ankle, striking her right wrist on slate. She states that she has rolled her ankle approximately 4 times in the past several months. She reports constant pain that is exacerbated with palpation and lifting. She also reports associated right shoulder pain, swelling and bruising to the right wrist for the past 3 days. No treatments tried PTA.   Pt also presents with a skin tag on her left back first noticed 1 year ago. No alleviating or aggravating factors.  Past Medical History  Diagnosis Date  . Panic attacks   . Gallstones   . Hyperthyroidism   . Iron deficiency anemia of pregnancy 12/18/2015  . Acute blood loss anemia 12/18/2015   Current Outpatient Prescriptions on File Prior to Visit  Medication Sig Dispense Refill  . ibuprofen (ADVIL,MOTRIN) 600 MG tablet Take 1 tablet (600 mg total) by mouth every 6 (six) hours. 30 tablet 0  . iron polysaccharides (NIFEREX) 150 MG capsule Take 1 capsule (150 mg total) by mouth 2 (two) times daily. 60 capsule 3  . magnesium oxide (MAG-OX) 400 (241.3 Mg) MG tablet Take 0.5 tablets (200 mg total) by mouth daily. 30 tablet 3  . oxyCODONE-acetaminophen (PERCOCET/ROXICET) 5-325 MG tablet Take 1-2 tablets by mouth every 4 (four) hours as needed for moderate pain. 30 tablet 0  . Prenatal Vit-Fe  Fumarate-FA (PRENATAL MULTIVITAMIN) TABS tablet Take 1 tablet by mouth daily at 12 noon.    Marland Kitchen acetaminophen (TYLENOL) 500 MG tablet Take 500 mg by mouth every 6 (six) hours as needed for moderate pain. Reported on 01/05/2016    . ranitidine (ZANTAC) 150 MG tablet Take 150 mg by mouth 3 (three) times daily as needed for heartburn. Reported on 01/05/2016     No current facility-administered medications on file prior to visit.   Allergies  Allergen Reactions  . Shellfish Allergy    Review of Systems  Musculoskeletal: Positive for joint swelling and arthralgias.  Skin: Positive for color change.      Objective:   Physical Exam  Constitutional: She is oriented to person, place, and time. She appears well-developed and well-nourished. No distress.  HENT:  Head: Normocephalic and atraumatic.  Eyes: Conjunctivae and EOM are normal.  Neck: Neck supple.  Cardiovascular: Normal rate.   Pulmonary/Chest: Effort normal. No respiratory distress.  Musculoskeletal: Normal range of motion.  R wrist: pain with ROM and pressure in the snuff box. Pain with thumb flexion against resistance. Tenderness to palpation over the mid radius which also hurts with supination and pronation.  R shoulder: tender over AC joint and deltoid with painful arc but no swelling or ecchymosis.   Neurological: She is alert and oriented to person, place, and time.  Skin: Skin is warm and dry.  She has a small obstructed mucus gland on the back and is reassured  Psychiatric: She has a normal mood and affect. Her behavior is normal.  Nursing note and vitals reviewed.  x-rays of the wrist and arm show no fracture    Assessment & Plan:  Pain, arm, right - Plan: DG Forearm Right  Pain, wrist joint, right - Plan: DG Wrist 2 Views Right  Strain rotator cuff-  Forearm wrist splint Range of motion Recheck 2 weeks if not well Ibuprofen if needed I have completed the patient encounter in its entirety as documented by the scribe,  with editing by me where necessary. Aram Domzalski P. Merla Riches, M.D.

## 2016-01-05 NOTE — Patient Instructions (Addendum)
Because you received an x-ray today, you will receive an invoice from [facility]. Please contact [facility] at [phone] with questions or concerns regarding your invoice. Our billing staff will not be able to assist you with those questions.   The ibuprofen is every 8 h not every 6 h

## 2016-02-11 ENCOUNTER — Other Ambulatory Visit: Payer: Self-pay | Admitting: Internal Medicine

## 2016-02-12 NOTE — Telephone Encounter (Signed)
Dr Merla Richesoolittle, do you want to RF this?

## 2016-02-17 NOTE — Telephone Encounter (Signed)
Needs recheck if still hurting

## 2016-05-25 ENCOUNTER — Ambulatory Visit (INDEPENDENT_AMBULATORY_CARE_PROVIDER_SITE_OTHER): Payer: BLUE CROSS/BLUE SHIELD | Admitting: Internal Medicine

## 2016-05-25 VITALS — BP 98/60 | HR 96 | Temp 98.1°F | Resp 18 | Ht <= 58 in | Wt 116.0 lb

## 2016-05-25 DIAGNOSIS — M24272 Disorder of ligament, left ankle: Secondary | ICD-10-CM | POA: Diagnosis not present

## 2016-05-25 DIAGNOSIS — F411 Generalized anxiety disorder: Secondary | ICD-10-CM | POA: Diagnosis not present

## 2016-05-25 MED ORDER — SERTRALINE HCL 50 MG PO TABS: ORAL_TABLET | ORAL | Status: DC

## 2016-05-25 NOTE — Patient Instructions (Addendum)
If any questions about your meds contact my PA Benny LennertSarah Weber, and you should recheck here with her in 3 mos(sooner if questions or med not satisfactory)

## 2016-05-25 NOTE — Progress Notes (Signed)
   Subjective:  By signing my name below, I, Stann Oresung-Kai Tsai, attest that this documentation has been prepared under the direction and in the presence of Ellamae Siaobert Kynzleigh Bandel, MD. Electronically Signed: Stann Oresung-Kai Tsai, Scribe. 05/25/2016 , 4:43 PM .  Patient was seen in Room 11 .   Patient ID: Marissa Shaffer, female    DOB: Dec 17, 1980, 35 y.o.   MRN: [id number] Chief Complaint  Patient presents with  . Ankle Injury    left ankle    HPI Marissa Shaffer is a 35 y.o. female who [facility] complaining of left ankle injury 2 days ago. Patient states her ankle buckled after stepping off of a curb. She's hurt her left ankle in the past, at least 4 times, and has been happening more often(the give way).  This time: She was carrying her 565 month old son at the time. She went to the ER right after, and he had a small skull fracture. He is doing well. She is now afraid to carry him, afraid she has damaged him, and is so anxious she can't sleep. Worries all day.  She's been having personal stress with loss of family recently as well.   Patient Active Problem List   Diagnosis Date Noted  . Iron deficiency anemia of pregnancy 12/18/2015  . Acute blood loss anemia 12/18/2015  . Active labor at term 12/16/2015  . Hyperthyroidism complicating pregnancy 12/16/2015  . Postpartum care following vaginal delivery (1/11) 12/16/2015    Allergies  Allergen Reactions  . Shellfish Allergy     Review of Systems  Constitutional: Negative for fever, chills and fatigue.  Cardiovascular: Negative for leg swelling.  Gastrointestinal: Negative for nausea, vomiting and diarrhea.  Musculoskeletal: Positive for arthralgias and gait problem. Negative for myalgias and joint swelling.  Neurological: Negative for weakness and numbness.       Objective:   Physical Exam  Constitutional: She is oriented to person, place, and time. She appears well-developed and well-nourished. No distress.  HENT:  Head:  Normocephalic and atraumatic.  Eyes: EOM are normal. Pupils are equal, round, and reactive to light.  Neck: Neck supple.  Cardiovascular: Normal rate.   Pulmonary/Chest: Effort normal. No respiratory distress.  Musculoskeletal: Normal range of motion.  Left ankle: minimal swelling, with no pain on rom but has laxity on inversion, she is tender medially into the medial tibia border without obvious swelling and resolving ecchymosis over the tibia; able to walk without pain but feels unsteady especially with carrying her infant  Neurological: She is alert and oriented to person, place, and time.  Skin: Skin is warm and dry.  Psychiatric: She has a normal mood and affect. Her behavior is normal.  Nursing note and vitals reviewed.   BP 98/60 mmHg  Pulse 96  Temp(Src) 98.1 F (36.7 C) (Oral)  Resp 18  Ht 4\' 9"  (1.448 m)  Wt 116 lb (52.617 kg)  BMI 25.10 kg/m2  SpO2 97%  LMP 02/07/2016    Assessment & Plan:  Ankle ligament laxity, left - Plan: Ambulatory referral to Orthopedic Surgery ---? Ankle rehab vs need to fix any instability///brace for now  GAD (generalized anxiety disorder) --- start zoloft(ok with breastfeeding)--f/u 1-392months S Weber PA-C ---?tsh at f/u given hyperthy in preg

## 2016-07-27 ENCOUNTER — Other Ambulatory Visit: Payer: Self-pay

## 2016-07-27 MED ORDER — SERTRALINE HCL 50 MG PO TABS: ORAL_TABLET | ORAL | 0 refills | Status: DC

## 2016-09-19 ENCOUNTER — Other Ambulatory Visit: Payer: Self-pay | Admitting: Physician Assistant

## 2016-11-01 ENCOUNTER — Ambulatory Visit (INDEPENDENT_AMBULATORY_CARE_PROVIDER_SITE_OTHER): Payer: BLUE CROSS/BLUE SHIELD | Admitting: Family Medicine

## 2016-11-01 VITALS — BP 108/64 | HR 98 | Temp 98.1°F | Resp 16 | Ht <= 58 in | Wt 126.0 lb

## 2016-11-01 DIAGNOSIS — J029 Acute pharyngitis, unspecified: Secondary | ICD-10-CM

## 2016-11-01 DIAGNOSIS — F411 Generalized anxiety disorder: Secondary | ICD-10-CM

## 2016-11-01 LAB — POCT RAPID STREP A (OFFICE): Rapid Strep A Screen: NEGATIVE

## 2016-11-01 MED ORDER — SERTRALINE HCL 100 MG PO TABS: 100.0000 mg | ORAL_TABLET | Freq: Every day | ORAL | 3 refills | Status: DC

## 2016-11-01 NOTE — Patient Instructions (Addendum)
Increased Zoloft 100 mg once daily for anxiety.  Strep was negative. A throat culture is pending.  Gargle with warm salt water for throat pain.  May take Ibuprofen as needed for throat pain.   IF you received an x-ray today, you will receive an invoice from [facility]. Please contact [facility] at [phone] with questions or concerns regarding your invoice.   IF you received labwork today, you will receive an invoice from [facility]. Please contact [facility] at [phone] with questions or concerns regarding your invoice.   Our billing staff will not be able to assist you with questions regarding bills from these companies.  You will be contacted with the lab results as soon as they are available. The fastest way to get your results is to activate your My Chart account. Instructions are located on the last page of this paperwork. If you have not heard from us regarding the results in 2 weeks, please contact this office.     Viral Illness, Adult Viruses are tiny germs that can get into a person's body and cause illness. There are many different types of viruses, and they cause many types of illness. Viral illnesses can range from mild to severe. They can affect various parts of the body. Common illnesses that are caused by a virus include colds and the flu. Viral illnesses also include serious conditions such as HIV/AIDS (human immunodeficiency virus/acquired immunodeficiency syndrome). A few viruses have been linked to certain cancers. What are the causes? Many types of viruses can cause illness. Viruses invade cells in your body, multiply, and cause the infected cells to malfunction or die. When the cell dies, it releases more of the virus. When this happens, you develop symptoms of the illness, and the virus continues to spread to other cells. If the virus takes over the function of the cell, it can cause the cell to divide and grow out of  control, as is the case when a virus causes cancer. Different viruses get into the body in different ways. You can get a virus by:  Swallowing food or water that is contaminated with the virus.  Breathing in droplets that have been coughed or sneezed into the air by an infected person.  Touching a surface that has been contaminated with the virus and then touching your eyes, nose, or mouth.  Being bitten by an insect or animal that carries the virus.  Having sexual contact with a person who is infected with the virus.  Being exposed to blood or fluids that contain the virus, either through an open cut or during a transfusion. If a virus enters your body, your body's defense system (immune system) will try to fight the virus. You may be at higher risk for a viral illness if your immune system is weak. What are the signs or symptoms? Symptoms vary depending on the type of virus and the location of the cells that it invades. Common symptoms of the main types of viral illnesses include: Cold and flu viruses  Fever.  Headache.  Sore throat.  Muscle aches.  Nasal congestion.  Cough. Digestive system (gastrointestinal) viruses  Fever.  Abdominal pain.  Nausea.  Diarrhea. Liver viruses (hepatitis)  Loss of appetite.  Tiredness.  Yellowing of the skin (jaundice). Brain and spinal cord viruses  Fever.  Headache.  Stiff neck.  Nausea and vomiting.  Confusion or sleepiness. Skin viruses  Warts.  Itching.  Rash. Sexually transmitted viruses  Discharge.  Swelling.  Redness.  Rash. How is this treated? Viruses can be difficult to treat because they live within cells. Antibiotic medicines do not treat viruses because these drugs do not get inside cells. Treatment for a viral illness may include:  Resting and drinking plenty of fluids.  Medicines to relieve symptoms. These can include over-the-counter medicine for pain and fever, medicines for cough or  congestion, and medicines to relieve diarrhea.  Antiviral medicines. These drugs are available only for certain types of viruses. They may help reduce flu symptoms if taken early. There are also many antiviral medicines for hepatitis and HIV/AIDS. Some viral illnesses can be prevented with vaccinations. A common example is the flu shot. Follow these instructions at home: Medicines  Take over-the-counter and prescription medicines only as told by your health care provider.  If you were prescribed an antiviral medicine, take it as told by your health care provider. Do not stop taking the medicine even if you start to feel better.  Be aware of when antibiotics are needed and when they are not needed. Antibiotics do not treat viruses. If your health care provider thinks that you may have a bacterial infection as well as a viral infection, you may get an antibiotic.  Do not ask for an antibiotic prescription if you have been diagnosed with a viral illness. That will not make your illness go away faster.  Frequently taking antibiotics when they are not needed can lead to antibiotic resistance. When this develops, the medicine no longer works against the bacteria that it normally fights. General instructions  Drink enough fluids to keep your urine clear or pale yellow.  Rest as much as possible.  Return to your normal activities as told by your health care provider. Ask your health care provider what activities are safe for you.  Keep all follow-up visits as told by your health care provider. This is important. How is this prevented? Take these actions to reduce your risk of viral infection:  Eat a healthy diet and get enough rest.  Wash your hands often with soap and water. This is especially important when you are in public places. If soap and water are not available, use hand sanitizer.  Avoid close contact with friends and family who have a viral illness.  If you travel to areas where  viral gastrointestinal infection is common, avoid drinking water or eating raw food.  Keep your immunizations up to date. Get a flu shot every year as told by your health care provider.  Do not share toothbrushes, nail clippers, razors, or needles with other people.  Always practice safe sex. Contact a health care provider if:  You have symptoms of a viral illness that do not go away.  Your symptoms come back after going away.  Your symptoms get worse. Get help right away if:  You have trouble breathing.  You have a severe headache or a stiff neck.  You have severe vomiting or abdominal pain. This information is not intended to replace advice given to you by your health care provider. Make sure you discuss any questions you have with your health care provider. Document Released: 04/01/2016 Document Revised: 05/04/2016 Document Reviewed: 04/01/2016 Elsevier Interactive Patient Education  2017 [employer]

## 2016-11-01 NOTE — Progress Notes (Signed)
Patient ID: Marissa Shaffer, female    DOB: [birth year], 35 y.o.   MRN: [id number]  PCP: No primary care provider on file.  Chief Complaint  Patient presents with  . Medication Problem    pt wants to increase zoloft  . Sore Throat    x 4 days    Subjective:   HPI 35 year old female presents for evaluation of treatment for GAD and sore throat x 4 days. She is a former Dr. Merla Richesoolittle patient.  GAD She feels that her medication isn't working. Still experiencing anxiety and excessive worry. Her father recently passed away and she is still trying to cope with this lost. Denies common side effects of medication although experiences a headache if she doesn't remember to take a dose of the medication.  Sore Throat Cleaning out father's house reports lots of mold and dust. Cleaned out there rental property and tenant advised her that he had strep. Times 3 days ago she began to experience a scratchy throat which is not sore bilaterally. Request to be tested for strep. Denies cough or nasal drainage.  Review of Systems See HPI Patient Active Problem List   Diagnosis Date Noted  . GAD (generalized anxiety disorder) 05/25/2016  . Iron deficiency anemia of pregnancy 12/18/2015  . Acute blood loss anemia 12/18/2015  . Active labor at term 12/16/2015  . Hyperthyroidism complicating pregnancy 12/16/2015  . Postpartum care following vaginal delivery (1/11) 12/16/2015     Prior to Admission medications   Medication Sig Start Date End Date Taking? Authorizing Provider  Prenatal Vit-Fe Fumarate-FA (PRENATAL MULTIVITAMIN) TABS tablet Take 1 tablet by mouth daily at 12 noon.   Yes Historical Provider, MD  sertraline (ZOLOFT) 50 MG tablet TAKE 1 TABLET BY MOUTH DAILY 09/20/16  Yes Morrell RiddleSarah L Weber, PA-C  iron polysaccharides (NIFEREX) 150 MG capsule Take 1 capsule (150 mg total) by mouth 2 (two) times daily. Patient not taking: Reported on 11/01/2016 12/18/15   Raelyn Moraolitta Dawson, CNM    oxyCODONE-acetaminophen (PERCOCET/ROXICET) 5-325 MG tablet Take 1-2 tablets by mouth every 4 (four) hours as needed for moderate pain. Patient not taking: Reported on 11/01/2016 12/18/15   Raelyn Moraolitta Dawson, CNM     Allergies  Allergen Reactions  . Shellfish Allergy        Objective:  Physical Exam  Constitutional: She is oriented to person, place, and time. She appears well-developed and well-nourished.  HENT:  Head: Normocephalic and atraumatic.  Right Ear: External ear normal.  Left Ear: External ear normal.  Nose: Nose normal.  Mouth/Throat: Oropharynx is clear and moist.  Eyes: Conjunctivae are normal. Pupils are equal, round, and reactive to light.  Neck: Normal range of motion. Neck supple.  Cardiovascular: Normal rate, regular rhythm, normal heart sounds and intact distal pulses.   Pulmonary/Chest: Effort normal and breath sounds normal.  Lymphadenopathy:    She has no cervical adenopathy.  Neurological: She is alert and oriented to person, place, and time.  Skin: Skin is warm and dry.  Psychiatric: She has a normal mood and affect. Her behavior is normal. Judgment and thought content normal.   Vitals:   11/01/16 1636  BP: 108/64  Pulse: 98  Resp: 16  Temp: 98.1 F (36.7 C)   Assessment & Plan:  1. GAD (generalized anxiety disorder) Increased Zoloft from 50 mg to 100 mg once daily for anxiety.  2. Sore throat  - POCT rapid strep A-negative - Culture, Group A Strep-pending   Strep was negative. A throat  culture is pending.  Gargle with warm salt water for throat pain.  May take Ibuprofen as needed for throat pain.  Follow-up as needed.  Godfrey PickKimberly S. Tiburcio PeaHarris, MSN, FNP-C [facility]

## 2016-11-03 LAB — CULTURE, GROUP A STREP: ORGANISM ID, BACTERIA: NORMAL

## 2017-11-22 ENCOUNTER — Other Ambulatory Visit: Payer: Self-pay | Admitting: Sports Medicine

## 2017-11-22 DIAGNOSIS — M542 Cervicalgia: Secondary | ICD-10-CM

## 2017-11-27 ENCOUNTER — Ambulatory Visit
Admission: RE | Admit: 2017-11-27 | Discharge: 2017-11-27 | Disposition: A | Discharge status: Home / Self Care | Payer: BLUE CROSS/BLUE SHIELD | Source: Ambulatory Visit | Attending: Sports Medicine | Admitting: Sports Medicine

## 2017-11-27 DIAGNOSIS — M542 Cervicalgia: Secondary | ICD-10-CM

## 2018-05-11 ENCOUNTER — Inpatient Hospital Stay (HOSPITAL_COMMUNITY)
Admission: EM | Admit: 2018-05-11 | Discharge: 2018-05-13 | DRG: 603 | Disposition: A | Discharge status: Home / Self Care | Payer: BLUE CROSS/BLUE SHIELD | Attending: Internal Medicine | Admitting: Internal Medicine

## 2018-05-11 ENCOUNTER — Encounter (HOSPITAL_COMMUNITY): Payer: Self-pay | Admitting: Emergency Medicine

## 2018-05-11 ENCOUNTER — Other Ambulatory Visit: Payer: Self-pay

## 2018-05-11 DIAGNOSIS — L03114 Cellulitis of left upper limb: Principal | ICD-10-CM | POA: Diagnosis present

## 2018-05-11 DIAGNOSIS — W57XXXA Bitten or stung by nonvenomous insect and other nonvenomous arthropods, initial encounter: Secondary | ICD-10-CM | POA: Diagnosis present

## 2018-05-11 DIAGNOSIS — F411 Generalized anxiety disorder: Secondary | ICD-10-CM | POA: Diagnosis present

## 2018-05-11 DIAGNOSIS — Z91013 Allergy to seafood: Secondary | ICD-10-CM

## 2018-05-11 DIAGNOSIS — L02414 Cutaneous abscess of left upper limb: Secondary | ICD-10-CM | POA: Diagnosis present

## 2018-05-11 DIAGNOSIS — E0590 Thyrotoxicosis, unspecified without thyrotoxic crisis or storm: Secondary | ICD-10-CM | POA: Diagnosis present

## 2018-05-11 DIAGNOSIS — I96 Gangrene, not elsewhere classified: Secondary | ICD-10-CM | POA: Diagnosis present

## 2018-05-11 DIAGNOSIS — F410 Panic disorder [episodic paroxysmal anxiety] without agoraphobia: Secondary | ICD-10-CM | POA: Diagnosis present

## 2018-05-11 DIAGNOSIS — E876 Hypokalemia: Secondary | ICD-10-CM | POA: Diagnosis present

## 2018-05-11 LAB — URINALYSIS, ROUTINE W REFLEX MICROSCOPIC
BILIRUBIN URINE: NEGATIVE
Glucose, UA: NEGATIVE mg/dL
Ketones, ur: 5 mg/dL — AB
NITRITE: NEGATIVE
PROTEIN: NEGATIVE mg/dL
Specific Gravity, Urine: 1.02 (ref 1.005–1.030)
pH: 5 (ref 5.0–8.0)

## 2018-05-11 LAB — CBC WITH DIFFERENTIAL/PLATELET
BASOS ABS: 0 10*3/uL (ref 0.0–0.1)
Basophils Relative: 0 %
EOS ABS: 0.1 10*3/uL (ref 0.0–0.7)
Eosinophils Relative: 1 %
HCT: 36.5 % (ref 36.0–46.0)
HEMOGLOBIN: 12.4 g/dL (ref 12.0–15.0)
LYMPHS ABS: 1.2 10*3/uL (ref 0.7–4.0)
LYMPHS PCT: 10 %
MCH: 29.7 pg (ref 26.0–34.0)
MCHC: 34 g/dL (ref 30.0–36.0)
MCV: 87.3 fL (ref 78.0–100.0)
Monocytes Absolute: 0.9 10*3/uL (ref 0.1–1.0)
Monocytes Relative: 7 %
NEUTROS PCT: 82 %
Neutro Abs: 9.7 10*3/uL — ABNORMAL HIGH (ref 1.7–7.7)
Platelets: 224 10*3/uL (ref 150–400)
RBC: 4.18 MIL/uL (ref 3.87–5.11)
RDW: 13.2 % (ref 11.5–15.5)
WBC: 11.8 10*3/uL — AB (ref 4.0–10.5)

## 2018-05-11 LAB — I-STAT BETA HCG BLOOD, ED (MC, WL, AP ONLY): I-stat hCG, quantitative: <5 m[IU]/mL (ref <5)

## 2018-05-11 LAB — COMPREHENSIVE METABOLIC PANEL
ALT: 16 U/L (ref 14–54)
AST: 18 U/L (ref 15–41)
Albumin: 4.3 g/dL (ref 3.5–5.0)
Alkaline Phosphatase: 57 U/L (ref 38–126)
Anion gap: 8 (ref 5–15)
BUN: 11 mg/dL (ref 6–20)
CHLORIDE: 105 mmol/L (ref 101–111)
CO2: 25 mmol/L (ref 22–32)
CREATININE: 0.73 mg/dL (ref 0.44–1.00)
Calcium: 8.6 mg/dL — ABNORMAL LOW (ref 8.9–10.3)
Glucose, Bld: 96 mg/dL (ref 65–99)
Potassium: 3.4 mmol/L — ABNORMAL LOW (ref 3.5–5.1)
Sodium: 138 mmol/L (ref 135–145)
Total Bilirubin: 0.6 mg/dL (ref 0.3–1.2)
Total Protein: 7.7 g/dL (ref 6.5–8.1)

## 2018-05-11 LAB — I-STAT CG4 LACTIC ACID, ED: LACTIC ACID, VENOUS: 0.53 mmol/L (ref 0.5–1.9)

## 2018-05-11 MED ORDER — ONDANSETRON HCL 4 MG/2ML IJ SOLN: 4.0000 mg | Freq: Four times a day (QID) | INTRAMUSCULAR | Status: DC | PRN

## 2018-05-11 MED ORDER — ACETAMINOPHEN 325 MG PO TABS: 650.0000 mg | ORAL_TABLET | Freq: Four times a day (QID) | ORAL | Status: DC | PRN

## 2018-05-11 MED ORDER — ACETAMINOPHEN 325 MG PO TABS
650.0000 mg | ORAL_TABLET | Freq: Once | ORAL | Status: AC
  Administered 2018-05-11: 650 mg via ORAL
  Filled 2018-05-11: qty 2

## 2018-05-11 MED ORDER — SODIUM CHLORIDE 0.9% FLUSH
3.0000 mL | Freq: Two times a day (BID) | INTRAVENOUS | Status: DC
  Administered 2018-05-13: 3 mL via INTRAVENOUS

## 2018-05-11 MED ORDER — VANCOMYCIN HCL 500 MG IV SOLR
500.0000 mg | Freq: Two times a day (BID) | INTRAVENOUS | Status: DC
  Administered 2018-05-12 – 2018-05-13 (×3): 500 mg via INTRAVENOUS
  Filled 2018-05-11 (×4): qty 500

## 2018-05-11 MED ORDER — VANCOMYCIN HCL IN DEXTROSE 1-5 GM/200ML-% IV SOLN
1000.0000 mg | Freq: Two times a day (BID) | INTRAVENOUS | Status: DC
  Administered 2018-05-11: 1000 mg via INTRAVENOUS
  Filled 2018-05-11 (×2): qty 200

## 2018-05-11 MED ORDER — SODIUM CHLORIDE 0.9 % IV SOLN
INTRAVENOUS | Status: AC
  Administered 2018-05-11: 1000 mL via INTRAVENOUS

## 2018-05-11 MED ORDER — ONDANSETRON HCL 4 MG PO TABS: 4.0000 mg | ORAL_TABLET | Freq: Four times a day (QID) | ORAL | Status: DC | PRN

## 2018-05-11 MED ORDER — POTASSIUM CHLORIDE CRYS ER 20 MEQ PO TBCR
20.0000 meq | EXTENDED_RELEASE_TABLET | Freq: Once | ORAL | Status: AC
  Administered 2018-05-11: 20 meq via ORAL
  Filled 2018-05-11: qty 1

## 2018-05-11 MED ORDER — SENNOSIDES-DOCUSATE SODIUM 8.6-50 MG PO TABS: 1.0000 | ORAL_TABLET | Freq: Every evening | ORAL | Status: DC | PRN

## 2018-05-11 MED ORDER — HYDROCODONE-ACETAMINOPHEN 5-325 MG PO TABS
1.0000 | ORAL_TABLET | ORAL | Status: DC | PRN
  Administered 2018-05-11: 1 via ORAL
  Administered 2018-05-12: 2 via ORAL
  Administered 2018-05-12: 1 via ORAL
  Administered 2018-05-13: 2 via ORAL
  Filled 2018-05-11 (×2): qty 1
  Filled 2018-05-11 (×3): qty 2

## 2018-05-11 MED ORDER — ACETAMINOPHEN 650 MG RE SUPP: 650.0000 mg | Freq: Four times a day (QID) | RECTAL | Status: DC | PRN

## 2018-05-11 MED ORDER — SODIUM CHLORIDE 0.9 % IV SOLN: 250.0000 mL | INTRAVENOUS | Status: DC | PRN

## 2018-05-11 MED ORDER — ENOXAPARIN SODIUM 40 MG/0.4ML ~~LOC~~ SOLN
40.0000 mg | SUBCUTANEOUS | Status: DC
  Filled 2018-05-11 (×2): qty 0.4

## 2018-05-11 MED ORDER — SODIUM CHLORIDE 0.9% FLUSH: 3.0000 mL | INTRAVENOUS | Status: DC | PRN

## 2018-05-11 NOTE — ED Notes (Signed)
ED TO INPATIENT HANDOFF REPORT  Name/Age/Gender Marissa Shaffer 37 y.o. female  Code Status    Code Status Orders  (From admission, onward)        Start     Ordered   05/11/18 1935  Full code  Continuous     05/11/18 1935    Code Status History    Date Active Date Inactive Code Status Order ID Comments User Context   12/16/2015 2028 12/18/2015 1621 Full Code [id number]  Brien Few, MD Inpatient   12/16/2015 1748 12/16/2015 2028 Full Code [id number]  Lona Kettle, RN Inpatient      Home/SNF/Other Home  Chief Complaint spider bite   Level of Care/Admitting Diagnosis ED Disposition    ED Disposition Condition [facility]: [facility] [100102]  Level of Care: Med-Surg [16]  Diagnosis: Cellulitis of left arm [283151]  Admitting Physician: Vianne Bulls [[id number]]  Attending Physician: Vianne Bulls [[id number]]  Estimated length of stay: past midnight tomorrow  Certification:: I certify this patient will need inpatient services for at least 2 midnights  PT Class (Do Not Modify): Inpatient [101]  PT Acc Code (Do Not Modify): Private [1]       Medical History Past Medical History:  Diagnosis Date  . Acute blood loss anemia 12/18/2015  . Gallstones   . Hyperthyroidism   . Iron deficiency anemia of pregnancy 12/18/2015  . Panic attacks     Allergies Allergies  Allergen Reactions  . Shellfish Allergy Hives    IV Location/Drains/Wounds Patient Lines/Drains/Airways Status   Active Line/Drains/Airways    Name:   Placement date:   Placement time:   Site:   Days:   Peripheral IV 05/11/18 Right Antecubital   05/11/18    1919    Antecubital   less than 1          Labs/Imaging Results for orders placed or performed during the hospital encounter of 05/11/18 (from the past 48 hour(s))  Comprehensive metabolic panel     Status: Abnormal   Collection Time: 05/11/18  1:07 PM  Result Value Ref Range   Sodium 138 135 -  145 mmol/L   Potassium 3.4 (L) 3.5 - 5.1 mmol/L   Chloride 105 101 - 111 mmol/L   CO2 25 22 - 32 mmol/L   Glucose, Bld 96 65 - 99 mg/dL   BUN 11 6 - 20 mg/dL   Creatinine, Ser 0.73 0.44 - 1.00 mg/dL   Calcium 8.6 (L) 8.9 - 10.3 mg/dL   Total Protein 7.7 6.5 - 8.1 g/dL   Albumin 4.3 3.5 - 5.0 g/dL   AST 18 15 - 41 U/L   ALT 16 14 - 54 U/L   Alkaline Phosphatase 57 38 - 126 U/L   Total Bilirubin 0.6 0.3 - 1.2 mg/dL   GFR calc non Af Amer >60 >60 mL/min   GFR calc Af Amer >60 >60 mL/min    Comment: (NOTE) The eGFR has been calculated using the CKD EPI equation. This calculation has not been validated in all clinical situations. eGFR's persistently <60 mL/min signify possible Chronic Kidney Disease.    Anion gap 8 5 - 15    Comment: Performed at [facility], [place] [address] [place], [place] [address]  CBC with Differential     Status: Abnormal   Collection Time: 05/11/18  1:07 PM  Result Value Ref Range   WBC 11.8 (H) 4.0 - 10.5 K/uL   RBC  4.18 3.87 - 5.11 MIL/uL   Hemoglobin 12.4 12.0 - 15.0 g/dL   HCT 36.5 36.0 - 46.0 %   MCV 87.3 78.0 - 100.0 fL   MCH 29.7 26.0 - 34.0 pg   MCHC 34.0 30.0 - 36.0 g/dL   RDW 13.2 11.5 - 15.5 %   Platelets 224 150 - 400 K/uL   Neutrophils Relative % 82 %   Neutro Abs 9.7 (H) 1.7 - 7.7 K/uL   Lymphocytes Relative 10 %   Lymphs Abs 1.2 0.7 - 4.0 K/uL   Monocytes Relative 7 %   Monocytes Absolute 0.9 0.1 - 1.0 K/uL   Eosinophils Relative 1 %   Eosinophils Absolute 0.1 0.0 - 0.7 K/uL   Basophils Relative 0 %   Basophils Absolute 0.0 0.0 - 0.1 K/uL    Comment: Performed at [facility] [place] [address], [place], [place] [address]  I-Stat beta hCG blood, ED     Status: None   Collection Time: 05/11/18  1:13 PM  Result Value Ref Range   I-stat hCG, quantitative <5.0 <5 mIU/mL   Comment 3            Comment:   GEST. AGE      CONC.  (mIU/mL)   <=1 WEEK        5 - 50     2 WEEKS       50 - 500     3 WEEKS        100 - 10,000     4 WEEKS     1,000 - 30,000        FEMALE AND NON-PREGNANT FEMALE:     LESS THAN 5 mIU/mL   Urinalysis, Routine w reflex microscopic     Status: Abnormal   Collection Time: 05/11/18  1:14 PM  Result Value Ref Range   Color, Urine YELLOW YELLOW   APPearance HAZY (A) CLEAR   Specific Gravity, Urine 1.020 1.005 - 1.030   pH 5.0 5.0 - 8.0   Glucose, UA NEGATIVE NEGATIVE mg/dL   Hgb urine dipstick LARGE (A) NEGATIVE   Bilirubin Urine NEGATIVE NEGATIVE   Ketones, ur 5 (A) NEGATIVE mg/dL   Protein, ur NEGATIVE NEGATIVE mg/dL   Nitrite NEGATIVE NEGATIVE   Leukocytes, UA LARGE (A) NEGATIVE   RBC / HPF 6-10 0 - 5 RBC/hpf   WBC, UA 11-20 0 - 5 WBC/hpf   Bacteria, UA RARE (A) NONE SEEN   Squamous Epithelial / LPF 6-10 0 - 5   Mucus PRESENT     Comment: Performed at [facility], [place] [address], [place], [place] [address]  I-Stat CG4 Lactic Acid, ED     Status: None   Collection Time: 05/11/18  1:15 PM  Result Value Ref Range   Lactic Acid, Venous 0.53 0.5 - 1.9 mmol/L   No results found.  Pending Labs Unresulted Labs (From admission, onward)   Start     Ordered   05/18/18 0500  Creatinine, serum  (enoxaparin (LOVENOX)    CrCl >/= 30 ml/min)  Weekly,   R    Comments:  while on enoxaparin therapy    05/11/18 1935   05/12/18 0500  HIV antibody (Routine Testing)  Tomorrow morning,   R     05/11/18 1935   05/12/18 3734  Basic metabolic panel  Tomorrow morning,   R     05/11/18 1935   05/12/18 0500  Magnesium  Tomorrow morning,   R  05/11/18 1935   05/12/18 0500  CBC WITH DIFFERENTIAL  Tomorrow morning,   R     05/11/18 1935      Vitals/Pain Today's Vitals   05/11/18 1728 05/11/18 1920 05/11/18 1921 05/11/18 1933  BP:   97/67 101/71  Pulse:   100 97  Resp:   16 18  Temp:      TempSrc:      SpO2:   99% 100%  Weight:      Height:      PainSc: 10-Worst pain ever 8       Isolation Precautions No active  isolations  Medications Medications  vancomycin (VANCOCIN) IVPB 1000 mg/200 mL premix (1,000 mg Intravenous New Bag/Given 05/11/18 1920)  enoxaparin (LOVENOX) injection 40 mg (has no administration in time range)  sodium chloride flush (NS) 0.9 % injection 3 mL (has no administration in time range)  sodium chloride flush (NS) 0.9 % injection 3 mL (has no administration in time range)  0.9 %  sodium chloride infusion (has no administration in time range)  acetaminophen (TYLENOL) tablet 650 mg (has no administration in time range)    Or  acetaminophen (TYLENOL) suppository 650 mg (has no administration in time range)  HYDROcodone-acetaminophen (NORCO/VICODIN) 5-325 MG per tablet 1-2 tablet (has no administration in time range)  senna-docusate (Senokot-S) tablet 1 tablet (has no administration in time range)  ondansetron (ZOFRAN) tablet 4 mg (has no administration in time range)    Or  ondansetron (ZOFRAN) injection 4 mg (has no administration in time range)  potassium chloride SA (K-DUR,KLOR-CON) CR tablet 20 mEq (has no administration in time range)  0.9 %  sodium chloride infusion (has no administration in time range)  acetaminophen (TYLENOL) tablet 650 mg (650 mg Oral Given 05/11/18 1732)    Mobility walks

## 2018-05-11 NOTE — Progress Notes (Signed)
  Pharmacy Antibiotic Note  Marissa Shaffer is a 37 y.o. female admitted on 05/11/2018 with L arm cellulitis. Patient reports spontaneous drainage from site at home; failed outpatient bactrim. Pharmacy has been consulted for vancomycin dosing.  Plan:  Vancomycin 1000 mg IV now, then 500 mg IV q12 hr (est AUC 528 based on SCr 0.73)  Measure vancomycin AUC at steady state as indicated   Height: 4\' 9"  (144.8 cm) Weight: 144 lb 14.4 oz (65.7 kg) IBW/kg (Calculated) : 38.6  Temp (24hrs), Avg:98.9 F (37.2 C), Min:98.8 F (37.1 C), Max:98.9 F (37.2 C)  Recent Labs  Lab 05/11/18 1307 05/11/18 1315  WBC 11.8*  --   CREATININE 0.73  --   LATICACIDVEN  --  0.53    Estimated Creatinine Clearance: 75.8 mL/min (by C-G formula based on SCr of 0.73 mg/dL).    Allergies  Allergen Reactions  . Shellfish Allergy Hives     Thank you for allowing pharmacy to be a part of this patient's care.  Bernadene Personrew Kymari Lollis, PharmD, BCPS [phone] 05/11/2018, 8:39 PM

## 2018-05-11 NOTE — ED Provider Notes (Signed)
[facility]-EMERGENCY DEPT Provider Note  CSN: [id number] Arrival date & time: 05/11/18  1135  History   Chief Complaint Chief Complaint  Patient presents with  . Insect Bite   HPI Marissa Shaffer is a 37 y.o. female with a medical history of hyperthyroidism and anxiety who presented to the ED for an insect bite. Patient first noticed an insect bite 4 days ago on her left forearm and the area was itchy and red initially. She used OTC cream, but over the last couple of days the area has become more red and painful. She describes the pain as burning and is tender to touch. Associated symptoms: fever (100.57F at home) and wrist pain.   Patient went to urgent care where she was prescribed Bactrim for cellulitis and has been taking it for 2 days along with NSAIDs. Despite PO medications, the area has become more red and painful. Patient is unsure what she was bit by, but thinks it may be a spider. Denies other skin rashes, arthralgias, paresthesias, headache and weakness. Denies recent travel and exposure to outdoor and wooded areas.   Past Medical History:  Diagnosis Date  . Acute blood loss anemia 12/18/2015  . Gallstones   . Hyperthyroidism   . Iron deficiency anemia of pregnancy 12/18/2015  . Panic attacks     Patient Active Problem List   Diagnosis Date Noted  . GAD (generalized anxiety disorder) 05/25/2016  . Iron deficiency anemia of pregnancy 12/18/2015  . Acute blood loss anemia 12/18/2015  . Active labor at term 12/16/2015  . Hyperthyroidism complicating pregnancy 12/16/2015  . Postpartum care following vaginal delivery (1/11) 12/16/2015    Past Surgical History:  Procedure Laterality Date  . WRIST SURGERY       OB History    Gravida  2   Para  2   Term  1   Preterm      AB      Living  1     SAB      TAB      Ectopic      Multiple  0   Live Births  1          Home Medications    Prior to Admission medications   Medication  Sig Start Date End Date Taking? Authorizing Provider  diclofenac (VOLTAREN) 75 MG EC tablet Take 75 mg by mouth 2 (two) times daily.   Yes [provider]  sulfamethoxazole-trimethoprim (BACTRIM DS,SEPTRA DS) 800-160 MG tablet Take 1 tablet by mouth 2 (two) times daily.   Yes [provider]  iron polysaccharides (NIFEREX) 150 MG capsule Take 1 capsule (150 mg total) by mouth 2 (two) times daily. Patient not taking: Reported on 11/01/2016 12/18/15   Raelyn Mora, CNM  sertraline (ZOLOFT) 100 MG tablet Take 1 tablet (100 mg total) by mouth daily. Patient not taking: Reported on 05/11/2018 11/01/16   Bing Neighbors, FNP    Family History Family History  Problem Relation Age of Onset  . COPD Mother   . Cancer Mother   . Diabetes Mother   . Kidney disease Father     Social History Social History   Tobacco Use  . Smoking status: Never Smoker  . Smokeless tobacco: Never Used  Substance Use Topics  . Alcohol use: No  . Drug use: Not on file     Allergies   Shellfish allergy   Review of Systems Review of Systems  Constitutional: Positive for fever. Negative for  activity change, chills, diaphoresis and fatigue.  Eyes: Negative for visual disturbance.  Respiratory: Negative for cough, chest tightness, shortness of breath and wheezing.   Cardiovascular: Negative for chest pain, palpitations and leg swelling.  Endocrine: Negative.   Musculoskeletal: Negative for gait problem and joint swelling.  Skin: Positive for wound.  Allergic/Immunologic: Negative.   Neurological: Negative for dizziness, weakness, light-headedness, numbness and headaches.  Hematological: Negative.      Physical Exam Updated Vital Signs BP 107/61 (BP Location: Right Arm)   Pulse (!) 119   Temp 98.9 F (37.2 C) (Oral)   Resp 18   Ht 4\' 9"  (1.448 m)   Wt 65.7 kg (144 lb 14.4 oz)   LMP 04/26/2018   SpO2 100%   BMI 31.36 kg/m   Physical Exam  Constitutional: She appears  well-developed and well-nourished. She is cooperative.  Eyes: Conjunctivae, EOM and lids are normal.  Neck: Normal range of motion and full passive range of motion without pain. Neck supple.  Cardiovascular: Normal rate, regular rhythm, normal heart sounds and normal pulses.  Pulmonary/Chest: Effort normal and breath sounds normal.  Musculoskeletal:       Left elbow: Normal.       Left wrist: She exhibits tenderness. She exhibits normal range of motion and no effusion.  Neurological: She is alert. She has normal strength. No sensory deficit. She exhibits normal muscle tone.  Reflex Scores:      Bicep reflexes are 2+ on the right side.      Brachioradialis reflexes are 2+ on the right side and 2+ on the left side. Skin: Skin is warm. Capillary refill takes less than 2 seconds. No rash noted.     Ulcerated 0.5cm lesion on left posterior forearm with surrounding erythema that extends to wrist.        ED Treatments / Results  Labs (all labs ordered are listed, but only abnormal results are displayed) Labs Reviewed  COMPREHENSIVE METABOLIC PANEL - Abnormal; Notable for the following components:      Result Value   Potassium 3.4 (*)    Calcium 8.6 (*)    All other components within normal limits  CBC WITH DIFFERENTIAL/PLATELET - Abnormal; Notable for the following components:   WBC 11.8 (*)    Neutro Abs 9.7 (*)    All other components within normal limits  URINALYSIS, ROUTINE W REFLEX MICROSCOPIC - Abnormal; Notable for the following components:   APPearance HAZY (*)    Hgb urine dipstick LARGE (*)    Ketones, ur 5 (*)    Leukocytes, UA LARGE (*)    Bacteria, UA RARE (*)    All other components within normal limits  I-STAT CG4 LACTIC ACID, ED  I-STAT BETA HCG BLOOD, ED ([facility], [facility], [facility] ONLY)   EKG None  Radiology No results found.  Procedures Procedures (including critical care time)  Medications Ordered in ED Medications  acetaminophen (TYLENOL) tablet 650 mg (has no  administration in time range)    Initial Impression / Assessment and Plan / ED Course  Triage vital signs and the nursing notes have been reviewed.  Pertinent labs & imaging results that were available during care of the patient were reviewed and considered in medical decision making (see chart for details).  Clinical Course as of May 11 1832  Fri May 11, 2018  1720 Tylenol 650mg  x1 given for pain.   [GM]  1826 Elevated WBC is expected. Infection at site of bite is appreciated due to warmth, pain and  erythema. May require IV abx given that site has continued to worsen even with PO abx.   [GM]    Clinical Course User Index [GM] Windy Carina, [place]   Patient presents for worsening cellulitis despite PO Bactrim use. While she is afebrile and not in distress on initial presentation, pt's cellulitis is concerning. She endorses fever at home and has severe tenderness to light tough and painful ROM of left wrist. Patient may require IV antibiotics for adequate treatment. Denies other s/s that would suggest cardiac or other joint involvement. Case was discussed with Dr. Gerhard Munch who agrees and contacted inpatient team to admit patient.  Final Clinical Impressions(s) / ED Diagnoses  1. Cellulitis of Left Forearm. Likely MRSA. Consult placed to inpatient providers. IV Vancomycin 1000mg  Q12H initiated in ED.  Dispo: Admit.  Final diagnoses:  None    ED Discharge Orders    None        Reva Bores 05/11/18 1844    Gerhard Munch, MD 05/11/18 2342

## 2018-05-11 NOTE — H&P (Signed)
History and Physical    LUVERTA KORTE ZOX:[id number] DOB: 01/01/1981 DOA: 05/11/2018  PCP: Bing Neighbors, FNP   Patient coming from: Home   Chief Complaint: Left forearm redness, swelling, pain   HPI: Marissa Shaffer is a 37 y.o. female with medical history significant for hyperthyroidism during pregnancy and  anxiety disorder, now presenting to the emergency department for evaluation of redness, swelling, and pain involving the left forearm.  Patient reports that she noted a small red nodule on her left forearm approximately 5 days ago.  There was increased swelling, pain, and redness the following day and she saw an urgent care provider who prescribed Bactrim which she has been taking for the past 2 days.  Despite this, she reports continued subjective fevers and continued spread of erythema.  She reports that there had been some drainage from the site spontaneously 2 days ago, but that has stopped.  She suspected this was a result of an insect bite, though notes that she did not actually see an insect.  She is not a diabetic.  Denies IV drug use.  ED Course: Upon arrival to the ED, patient is found to be afebrile, saturating well on room air, tachycardic in the 110s, and with stable blood pressure.  Chemistry panel is notable for potassium 3.4, CBC features a leukocytosis to 11,800, and lactic acid is reassuringly normal.  Patient was treated with acetaminophen and vancomycin in the ED.  She remains hemodynamically stable and will be admitted to the medical-surgical unit for ongoing evaluation and management of left forearm cellulitis, worsening despite appropriate outpatient treatment.  Review of Systems:  All other systems reviewed and apart from HPI, are negative.  Past Medical History:  Diagnosis Date  . Acute blood loss anemia 12/18/2015  . Gallstones   . Hyperthyroidism   . Iron deficiency anemia of pregnancy 12/18/2015  . Panic attacks     Past Surgical History:    Procedure Laterality Date  . WRIST SURGERY       reports that she has never smoked. She has never used smokeless tobacco. She reports that she does not drink alcohol. Her drug history is not on file.  Allergies  Allergen Reactions  . Shellfish Allergy Hives    Family History  Problem Relation Age of Onset  . COPD Mother   . Cancer Mother   . Diabetes Mother   . Kidney disease Father      Prior to Admission medications   Medication Sig Start Date End Date Taking? Authorizing Provider  diclofenac (VOLTAREN) 75 MG EC tablet Take 75 mg by mouth 2 (two) times daily.   Yes [provider]  sulfamethoxazole-trimethoprim (BACTRIM DS,SEPTRA DS) 800-160 MG tablet Take 1 tablet by mouth 2 (two) times daily.   Yes [provider]  iron polysaccharides (NIFEREX) 150 MG capsule Take 1 capsule (150 mg total) by mouth 2 (two) times daily. Patient not taking: Reported on 11/01/2016 12/18/15   Raelyn Mora, CNM  sertraline (ZOLOFT) 100 MG tablet Take 1 tablet (100 mg total) by mouth daily. Patient not taking: Reported on 05/11/2018 11/01/16   Bing Neighbors, FNP    Physical Exam: Vitals:   05/11/18 1440 05/11/18 1649 05/11/18 1921 05/11/18 1933  BP: 113/79 107/61 97/67 101/71  Pulse: (!) 105 (!) 119 100 97  Resp: 18 18 16 18   Temp: 98.9 F (37.2 C) 98.9 F (37.2 C)    TempSrc: Oral Oral    SpO2: 98% 100% 99% 100%  Weight:      Height:          Constitutional: NAD, calm  Eyes: PERTLA, lids and conjunctivae normal ENMT: Mucous membranes are moist. Posterior pharynx clear of any exudate or lesions.   Neck: normal, supple, no masses, no thyromegaly Respiratory: clear to auscultation bilaterally, no wheezing, no crackles. Normal respiratory effort.    Cardiovascular: S1 & S2 heard, regular rate and rhythm. No significant JVD. Abdomen: No distension, no tenderness, soft. Bowel sounds normal.  Musculoskeletal: no clubbing / cyanosis. No joint deformity upper and  lower extremities.   Skin: Red, tender nodule at left forearm with fluctuance and overlying crust, no drainage. Warm, dry, well-perfused. Neurologic: CN 2-12 grossly intact. Sensation intact. Strength 5/5 in all 4 limbs.  Psychiatric: Alert and oriented x 3. Calm, cooperative.     Labs on Admission: I have personally reviewed following labs and imaging studies  CBC: Recent Labs  Lab 05/11/18 1307  WBC 11.8*  NEUTROABS 9.7*  HGB 12.4  HCT 36.5  MCV 87.3  PLT 224   Basic Metabolic Panel: Recent Labs  Lab 05/11/18 1307  NA 138  K 3.4*  CL 105  CO2 25  GLUCOSE 96  BUN 11  CREATININE 0.73  CALCIUM 8.6*   GFR: Estimated Creatinine Clearance: 75.8 mL/min (by C-G formula based on SCr of 0.73 mg/dL). Liver Function Tests: Recent Labs  Lab 05/11/18 1307  AST 18  ALT 16  ALKPHOS 57  BILITOT 0.6  PROT 7.7  ALBUMIN 4.3   No results for input(s): LIPASE, AMYLASE in the last 168 hours. No results for input(s): AMMONIA in the last 168 hours. Coagulation Profile: No results for input(s): INR, PROTIME in the last 168 hours. Cardiac Enzymes: No results for input(s): CKTOTAL, CKMB, CKMBINDEX, TROPONINI in the last 168 hours. BNP (last 3 results) No results for input(s): PROBNP in the last 8760 hours. HbA1C: No results for input(s): HGBA1C in the last 72 hours. CBG: No results for input(s): GLUCAP in the last 168 hours. Lipid Profile: No results for input(s): CHOL, HDL, LDLCALC, TRIG, CHOLHDL, LDLDIRECT in the last 72 hours. Thyroid Function Tests: No results for input(s): TSH, T4TOTAL, FREET4, T3FREE, THYROIDAB in the last 72 hours. Anemia Panel: No results for input(s): VITAMINB12, FOLATE, FERRITIN, TIBC, IRON, RETICCTPCT in the last 72 hours. Urine analysis:    Component Value Date/Time   COLORURINE YELLOW 05/11/2018 1314   APPEARANCEUR HAZY (A) 05/11/2018 1314   LABSPEC 1.020 05/11/2018 1314   PHURINE 5.0 05/11/2018 1314   GLUCOSEU NEGATIVE 05/11/2018 1314    HGBUR LARGE (A) 05/11/2018 1314   BILIRUBINUR NEGATIVE 05/11/2018 1314   BILIRUBINUR neg 06/06/2013 1454   KETONESUR 5 (A) 05/11/2018 1314   PROTEINUR NEGATIVE 05/11/2018 1314   UROBILINOGEN 0.2 06/06/2013 1454   UROBILINOGEN 0.2 02/21/2013 0256   NITRITE NEGATIVE 05/11/2018 1314   LEUKOCYTESUR LARGE (A) 05/11/2018 1314   Sepsis Labs: @LABRCNTIP (procalcitonin:4,lacticidven:4) )No results found for this or any previous visit (from the past 240 hour(s)).   Radiological Exams on Admission: No results found.  EKG: Not performed.   Assessment/Plan   1. Left arm cellulitis  - Presents with increasing redness, swelling, and pain to left forearm despite outpt tx with Bactrim  - She reports subjective fevers at home, afebrile in ED with tachycardia and leukocytosis, normal lactate  - Treated with vancomycin in ED  - Elevate LUE, outline area of involvement with skin pen, continue vancomycin, may need I&D   2. Hypokalemia - Serum potassium  is 3.4 on admission - Treated with 20 mEq oral potassium  - Repeat chem panel in am    DVT prophylaxis: Lovenox Code Status: Full  Family Communication: Discussed with patient  Consults called: None Admission status: Inpatient    Briscoe Deutscherimothy S Delonda Coley, MD [facility] Pager [phone]  If 7PM-7AM, please contact night-coverage [email or web address] Password [email or web address]  05/11/2018, 8:00 PM

## 2018-05-11 NOTE — ED Triage Notes (Signed)
Per pt, thinks she was bit by a spider a couple of days ago-states she went to [facility] where they diagnosed her with cellulitis-states she has been on antibiotics for 2 days-was told to come to ED if symptoms worsened-left wrist/arm red a swollen

## 2018-05-12 DIAGNOSIS — D72829 Elevated white blood cell count, unspecified: Secondary | ICD-10-CM | POA: Diagnosis not present

## 2018-05-12 DIAGNOSIS — L03114 Cellulitis of left upper limb: Secondary | ICD-10-CM | POA: Diagnosis not present

## 2018-05-12 DIAGNOSIS — E876 Hypokalemia: Secondary | ICD-10-CM

## 2018-05-12 DIAGNOSIS — L0291 Cutaneous abscess, unspecified: Secondary | ICD-10-CM | POA: Diagnosis not present

## 2018-05-12 LAB — CBC WITH DIFFERENTIAL/PLATELET
Basophils Absolute: 0 10*3/uL (ref 0.0–0.1)
Basophils Relative: 0 %
EOS ABS: 0.2 10*3/uL (ref 0.0–0.7)
Eosinophils Relative: 2 %
HCT: 33.9 % — ABNORMAL LOW (ref 36.0–46.0)
Hemoglobin: 11.6 g/dL — ABNORMAL LOW (ref 12.0–15.0)
LYMPHS ABS: 1.3 10*3/uL (ref 0.7–4.0)
Lymphocytes Relative: 14 %
MCH: 29.4 pg (ref 26.0–34.0)
MCHC: 34.2 g/dL (ref 30.0–36.0)
MCV: 86 fL (ref 78.0–100.0)
MONOS PCT: 8 %
Monocytes Absolute: 0.8 10*3/uL (ref 0.1–1.0)
Neutro Abs: 7.4 10*3/uL (ref 1.7–7.7)
Neutrophils Relative %: 76 %
Platelets: 230 10*3/uL (ref 150–400)
RBC: 3.94 MIL/uL (ref 3.87–5.11)
RDW: 13.2 % (ref 11.5–15.5)
WBC: 9.6 10*3/uL (ref 4.0–10.5)

## 2018-05-12 LAB — BASIC METABOLIC PANEL
Anion gap: 8 (ref 5–15)
BUN: 10 mg/dL (ref 6–20)
CALCIUM: 8.3 mg/dL — AB (ref 8.9–10.3)
CO2: 21 mmol/L — AB (ref 22–32)
CREATININE: 0.6 mg/dL (ref 0.44–1.00)
Chloride: 108 mmol/L (ref 101–111)
GFR calc non Af Amer: >60 mL/min (ref >60)
Glucose, Bld: 98 mg/dL (ref 65–99)
Potassium: 3.7 mmol/L (ref 3.5–5.1)
SODIUM: 137 mmol/L (ref 135–145)

## 2018-05-12 LAB — HIV ANTIBODY (ROUTINE TESTING W REFLEX): HIV Screen 4th Generation wRfx: NONREACTIVE

## 2018-05-12 LAB — MAGNESIUM: Magnesium: 1.8 mg/dL (ref 1.7–2.4)

## 2018-05-12 MED ORDER — LIDOCAINE HCL 1 % IJ SOLN
INTRAMUSCULAR | Status: AC
  Filled 2018-05-12: qty 40

## 2018-05-12 MED ORDER — BACITRACIN ZINC 500 UNIT/GM EX OINT
TOPICAL_OINTMENT | Freq: Two times a day (BID) | CUTANEOUS | Status: DC
  Administered 2018-05-12 – 2018-05-13 (×3): via TOPICAL
  Filled 2018-05-12: qty 28.35

## 2018-05-12 NOTE — Discharge Instructions (Signed)
Remove dressing, cleanse wound with soap/running water Re-dress with bacitracin ointment in the center of the wound, cover with 2x2 dry gauze Stop dressing changes once raw, open area has healed

## 2018-05-12 NOTE — Consult Note (Signed)
ORTHOPAEDIC CONSULTATION HISTORY & PHYSICAL REQUESTING PHYSICIAN: Glade Lloyd, MD  Chief Complaint: Left forearm abscess  HPI: Marissa Shaffer is a 37 y.o. female who has developed an abscess of the left forearm, the dorsal ulnar aspect just distal to the mid forearm.  She was admitted yesterday for IV antibiotics.  Unfortunately, the abscess evident in the clinical photographs was not addressed in the emergency room.  She was admitted for IV antibiotic management and placed on a regular diet.  The patient's care transition from Dr. Antionette Char to Dr. Hanley Ben, who consulted me to perform incision and drainage of the abscess  Past Medical History:  Diagnosis Date  . Acute blood loss anemia 12/18/2015  . Gallstones   . Hyperthyroidism   . Iron deficiency anemia of pregnancy 12/18/2015  . Panic attacks    Past Surgical History:  Procedure Laterality Date  . WRIST SURGERY     Social History   Socioeconomic History  . Marital status: Married    Spouse name: Not on file  . Number of children: Not on file  . Years of education: Not on file  . Highest education level: Not on file  Occupational History  . Not on file  Social Needs  . Financial resource strain: Not on file  . Food insecurity:    Worry: Not on file    Inability: Not on file  . Transportation needs:    Medical: Not on file    Non-medical: Not on file  Tobacco Use  . Smoking status: Never Smoker  . Smokeless tobacco: Never Used  Substance and Sexual Activity  . Alcohol use: No  . Drug use: Not on file  . Sexual activity: Yes  Lifestyle  . Physical activity:    Days per week: Not on file    Minutes per session: Not on file  . Stress: Not on file  Relationships  . Social connections:    Talks on phone: Not on file    Gets together: Not on file    Attends religious service: Not on file    Active member of club or organization: Not on file    Attends meetings of clubs or organizations: Not on file    Relationship  status: Not on file  Other Topics Concern  . Not on file  Social History Narrative  . Not on file   Family History  Problem Relation Age of Onset  . COPD Mother   . Cancer Mother   . Diabetes Mother   . Kidney disease Father    Allergies  Allergen Reactions  . Shellfish Allergy Hives   Prior to Admission medications   Medication Sig Start Date End Date Taking? Authorizing Provider  diclofenac (VOLTAREN) 75 MG EC tablet Take 75 mg by mouth 2 (two) times daily.   Yes [provider]  sulfamethoxazole-trimethoprim (BACTRIM DS,SEPTRA DS) 800-160 MG tablet Take 1 tablet by mouth 2 (two) times daily.   Yes [provider]  iron polysaccharides (NIFEREX) 150 MG capsule Take 1 capsule (150 mg total) by mouth 2 (two) times daily. Patient not taking: Reported on 11/01/2016 12/18/15   Raelyn Mora, CNM  sertraline (ZOLOFT) 100 MG tablet Take 1 tablet (100 mg total) by mouth daily. Patient not taking: Reported on 05/11/2018 11/01/16   Bing Neighbors, FNP   No results found.  Positive ROS: All other systems have been reviewed and were otherwise negative with the exception of those mentioned in the HPI and as above.  Physical  Exam: Vitals: Refer to EMR. Constitutional:  WD, WN, NAD HEENT:  NCAT, EOMI Neuro/Psych:  Alert & oriented to person, place, and time; appropriate mood & affect Lymphatic: No generalized extremity edema or lymphadenopathy Extremities / MSK:  The extremities are normal with respect to appearance, ranges of motion, joint stability, muscle strength/tone, sensation, & perfusion except as otherwise noted:  Left forearm is a several centimeter in diameter area of redness with swelling.  The central area is indurated, with fluctuant borders and central necrotic scab about a centimeter diameter.  With some pressure, purulence can be expressed from the edge of the scab.  There is pain with palpation about the central lesion especially.  The lesion in the  center is raised, with central ulceration of it.  Assessment: Left forearm subcutaneous abscess  Plan/procedure: I discussed these findings with her and recommended incision and drainage at the bedside.  She consented.  1% lidocaine was infiltrated in the horseshoe about the central lesion.  After adequate anesthesia been obtained, I longitudinally incised the central lesion for about an inch to an inch and a half.  Gross purulence was evacuated.  The black necrotic skin edge was excised.  With spreading dissection, I explored the extent of the abscess and it did not appear to have any communication in the subcutaneous plane much into the area of overlying cellulitic skin.  It was largely contained to the central area.  It was irrigated copiously and hemostasis obtained with direct pressure.  A moist dressing was applied to the central area, followed by dry over dressing.  I discussed healing by secondary intent with her and ongoing wound care.  She will follow-up with me only if questions/concerns develop.  Infection care per primary service.  Cliffton Astersavid A. Janee Mornhompson, MD      Orthopaedic & Hand Surgery [facility] [address] [place], [place]  [id number] Office: [phone] Mobile: [phone]  05/12/2018, 10:42 AM

## 2018-05-12 NOTE — Progress Notes (Signed)
Patient ID: Marissa Shaffer, female   DOB: November 20, 1981, 37 y.o.   MRN: [id number]  PROGRESS NOTE    SARAHANN HORRELL  WUJ:[id number] DOB: [birth year] DOA: 05/11/2018 PCP: Bing Neighbors, FNP   Brief Narrative:  37 year old female with history of hypothyroidism during pregnancy and anxiety disorder presented with left forearm redness, swelling and pain not improving with 2 days of oral Bactrim.  She was admitted with cellulitis on IV vancomycin.   Assessment & Plan:   Principal Problem:   Cellulitis of left arm Active Problems:   Hypokalemia   Left forearm cellulitis with probable abscess -Which did not improve with 2 days of oral Bactrim.  Continue vancomycin.  Spoke to on-call hand surgeon on phone for probable I&D.  Follow cultures  Leukocytosis -Probably secondary to above.  Improved.  Repeat a.m. labs  Hypokalemia -Improved   DVT prophylaxis: SCDs.  DC Lovenox Code Status: Full Family Communication: None at bedside Disposition Plan: Probable discharge in 1 to 2 days  Consultants: Hand surgeon  Procedures: None  Antimicrobials: Vancomycin from 05/11/2018 onwards   Subjective: Patient seen and examined at bedside.  She feels better and thinks her forearm redness is improving.  No overnight fever, nausea or vomiting.  Objective: Vitals:   05/11/18 1921 05/11/18 1933 05/11/18 2033 05/12/18 0529  BP: 97/67 101/71 109/60 111/75  Pulse: 100 97 (!) 109 99  Resp: 16 18  16   Temp:   98.8 F (37.1 C) 98.9 F (37.2 C)  TempSrc:   Oral Oral  SpO2: 99% 100% 100% 97%  Weight:      Height:        Intake/Output Summary (Last 24 hours) at 05/12/2018 1021 Last data filed at 05/12/2018 0600 Gross per 24 hour  Intake 1018.33 ml  Output -  Net 1018.33 ml   Filed Weights   05/11/18 1209  Weight: 65.7 kg (144 lb 14.4 oz)    Examination:  General exam: Appears calm and comfortable  Respiratory system: Bilateral decreased breath sound at bases Cardiovascular system: S1  & S2 heard, rate controlled.  Gastrointestinal system: Abdomen is nondistended, soft and nontender. Normal bowel sounds heard. Extremities: No cyanosis, clubbing, edema  Skin: Left forearm is erythematous, mildly tender, mild fluctuance with black crust, no drainage   Data Reviewed: I have personally reviewed following labs and imaging studies  CBC: Recent Labs  Lab 05/11/18 1307 05/12/18 0508  WBC 11.8* 9.6  NEUTROABS 9.7* 7.4  HGB 12.4 11.6*  HCT 36.5 33.9*  MCV 87.3 86.0  PLT 224 230   Basic Metabolic Panel: Recent Labs  Lab 05/11/18 1307 05/12/18 0508  NA 138 137  K 3.4* 3.7  CL 105 108  CO2 25 21*  GLUCOSE 96 98  BUN 11 10  CREATININE 0.73 0.60  CALCIUM 8.6* 8.3*  MG  --  1.8   GFR: Estimated Creatinine Clearance: 75.8 mL/min (by C-G formula based on SCr of 0.6 mg/dL). Liver Function Tests: Recent Labs  Lab 05/11/18 1307  AST 18  ALT 16  ALKPHOS 57  BILITOT 0.6  PROT 7.7  ALBUMIN 4.3   No results for input(s): LIPASE, AMYLASE in the last 168 hours. No results for input(s): AMMONIA in the last 168 hours. Coagulation Profile: No results for input(s): INR, PROTIME in the last 168 hours. Cardiac Enzymes: No results for input(s): CKTOTAL, CKMB, CKMBINDEX, TROPONINI in the last 168 hours. BNP (last 3 results) No results for input(s): PROBNP in the last 8760 hours. HbA1C: No results  for input(s): HGBA1C in the last 72 hours. CBG: No results for input(s): GLUCAP in the last 168 hours. Lipid Profile: No results for input(s): CHOL, HDL, LDLCALC, TRIG, CHOLHDL, LDLDIRECT in the last 72 hours. Thyroid Function Tests: No results for input(s): TSH, T4TOTAL, FREET4, T3FREE, THYROIDAB in the last 72 hours. Anemia Panel: No results for input(s): VITAMINB12, FOLATE, FERRITIN, TIBC, IRON, RETICCTPCT in the last 72 hours. Sepsis Labs: Recent Labs  Lab 05/11/18 1315  LATICACIDVEN 0.53    No results found for this or any previous visit (from the past 240  hour(s)).       Radiology Studies: No results found.      Scheduled Meds: . lidocaine      . sodium chloride flush  3 mL Intravenous Q12H   Continuous Infusions: . sodium chloride    . vancomycin 500 mg (05/12/18 0941)     LOS: 1 day        Glade LloydKshitiz Neyla Gauntt, MD [facility] Pager [phone]  If 7PM-7AM, please contact night-coverage [email or web address] Password [email or web address] 05/12/2018, 10:21 AM

## 2018-05-13 DIAGNOSIS — L0291 Cutaneous abscess, unspecified: Secondary | ICD-10-CM | POA: Diagnosis not present

## 2018-05-13 DIAGNOSIS — L03114 Cellulitis of left upper limb: Secondary | ICD-10-CM | POA: Diagnosis not present

## 2018-05-13 DIAGNOSIS — E876 Hypokalemia: Secondary | ICD-10-CM | POA: Diagnosis not present

## 2018-05-13 LAB — BASIC METABOLIC PANEL
ANION GAP: 6 (ref 5–15)
BUN: 9 mg/dL (ref 6–20)
CALCIUM: 8.1 mg/dL — AB (ref 8.9–10.3)
CO2: 24 mmol/L (ref 22–32)
CREATININE: 0.57 mg/dL (ref 0.44–1.00)
Chloride: 107 mmol/L (ref 101–111)
GFR calc Af Amer: >60 mL/min (ref >60)
GLUCOSE: 108 mg/dL — AB (ref 65–99)
Potassium: 3.7 mmol/L (ref 3.5–5.1)
Sodium: 137 mmol/L (ref 135–145)

## 2018-05-13 LAB — CBC WITH DIFFERENTIAL/PLATELET
BASOS ABS: 0 10*3/uL (ref 0.0–0.1)
Basophils Relative: 0 %
EOS PCT: 3 %
Eosinophils Absolute: 0.2 10*3/uL (ref 0.0–0.7)
HCT: 33.6 % — ABNORMAL LOW (ref 36.0–46.0)
Hemoglobin: 11.2 g/dL — ABNORMAL LOW (ref 12.0–15.0)
LYMPHS PCT: 30 %
Lymphs Abs: 2.1 10*3/uL (ref 0.7–4.0)
MCH: 28.9 pg (ref 26.0–34.0)
MCHC: 33.3 g/dL (ref 30.0–36.0)
MCV: 86.8 fL (ref 78.0–100.0)
MONO ABS: 0.4 10*3/uL (ref 0.1–1.0)
Monocytes Relative: 6 %
Neutro Abs: 4.2 10*3/uL (ref 1.7–7.7)
Neutrophils Relative %: 61 %
PLATELETS: 224 10*3/uL (ref 150–400)
RBC: 3.87 MIL/uL (ref 3.87–5.11)
RDW: 13.1 % (ref 11.5–15.5)
WBC: 6.9 10*3/uL (ref 4.0–10.5)

## 2018-05-13 LAB — MAGNESIUM: Magnesium: 1.7 mg/dL (ref 1.7–2.4)

## 2018-05-13 MED ORDER — DOXYCYCLINE HYCLATE 100 MG PO TBEC: 100.0000 mg | DELAYED_RELEASE_TABLET | Freq: Two times a day (BID) | ORAL | 0 refills | Status: AC

## 2018-05-13 MED ORDER — BACITRACIN ZINC 500 UNIT/GM EX OINT: TOPICAL_OINTMENT | Freq: Two times a day (BID) | CUTANEOUS | 0 refills | Status: AC

## 2018-05-13 MED ORDER — HYDROCODONE-ACETAMINOPHEN 5-325 MG PO TABS: 1.0000 | ORAL_TABLET | Freq: Four times a day (QID) | ORAL | 0 refills | Status: AC | PRN

## 2018-05-13 NOTE — Discharge Summary (Signed)
Physician Discharge Summary  Marissa Shaffer:[id number] DOB: January 29, 1981 DOA: 05/11/2018  PCP: Bing Neighbors, FNP  Admit date: 05/11/2018 Discharge date: 05/13/2018  Admitted From: Home Disposition:  Home  Recommendations for Outpatient Follow-up:  1. Follow up with PCP in 1 week 2. Follow-up with Dr. Theodoro Grist Thompson/hand surgery as an outpatient if needed 3. Wound care as per hand surgery recommendations  Home Health: No Equipment/Devices: None  Discharge Condition: Stable CODE STATUS: Full Diet recommendation:  Regular   Brief/Interim Summary: 37 year old female with history of hypothyroidism during pregnancy and anxiety disorder presented with left forearm redness, swelling and pain not improving with 2 days of oral Bactrim.  She was admitted with cellulitis on IV vancomycin.  She had bedside I&D done by hand surgery on 05/12/2018.  She is feeling better and will be discharged home on oral doxycycline for at least 7 more days.  Outpatient follow-up with hand surgery as needed and dressing changes as per hand surgery recommendations.   Discharge Diagnoses:  Principal Problem:   Cellulitis of left arm Active Problems:   Hypokalemia  Left forearm cellulitis with abscess -Which did not improve with 2 days of oral Bactrim.  -Currently on IV vancomycin.  Status post I&D on 05/12/2018 by hand surgery.  Patient feeling better, cellulitis is improving.  Discharge home on oral doxycycline for at least 7 more days.  Wound care as per hand surgery recommendations.   -Outpatient follow-up with primary care provider and hand surgery  Leukocytosis -Probably secondary to above.  Improved.   Hypokalemia -Improved     Discharge Instructions   Allergies as of 05/13/2018      Reactions   Shellfish Allergy Hives      Medication List    STOP taking these medications   iron polysaccharides 150 MG capsule Commonly known as:  NIFEREX   sertraline 100 MG tablet Commonly known as:   ZOLOFT   sulfamethoxazole-trimethoprim 800-160 MG tablet Commonly known as:  BACTRIM DS,SEPTRA DS     TAKE these medications   bacitracin ointment Apply topically 2 (two) times daily.   diclofenac 75 MG EC tablet Commonly known as:  VOLTAREN Take 75 mg by mouth 2 (two) times daily.   doxycycline 100 MG EC tablet Commonly known as:  DORYX Take 1 tablet (100 mg total) by mouth 2 (two) times daily for 7 days.   HYDROcodone-acetaminophen 5-325 MG tablet Commonly known as:  NORCO/VICODIN Take 1 tablet by mouth every 6 (six) hours as needed for moderate pain or severe pain.      Follow-up Information    Mack Hook, MD Follow up.   Specialty:  Orthopedic Surgery Why:  if questions or concerns develop Contact information: [address] [place] [address] [phone]        PCP. Schedule an appointment as soon as possible for a visit in 1 week(s).          Allergies  Allergen Reactions  . Shellfish Allergy Hives    Consultations:  Hand surgery   Procedures/Studies: I&D of right forearm abscess on 05/12/2018   Subjective: Patient seen and examined at bedside.  She had fever last night but feels better this morning.  Her forearm pain is much improved and she thinks she is ready to go home.  No overnight vomiting or diarrhea.  Discharge Exam: Vitals:   05/12/18 2158 05/13/18 0622  BP: 103/70 (!) 104/58  Pulse: (!) 105 78  Resp: 18 18  Temp: 100.1 F (37.8 C) 98.5  F (36.9 C)  SpO2: 100% 100%   Vitals:   05/12/18 0529 05/12/18 1538 05/12/18 2158 05/13/18 0622  BP: 111/75 98/62 103/70 (!) 104/58  Pulse: 99 95 (!) 105 78  Resp: 16 16 18 18   Temp: 98.9 F (37.2 C) 98.8 F (37.1 C) 100.1 F (37.8 C) 98.5 F (36.9 C)  TempSrc: Oral Oral Oral Oral  SpO2: 97% 99% 100% 100%  Weight:      Height:        General: Pt is alert, awake, not in acute distress Cardiovascular: Rate controlled, S1/S2 + Respiratory: Bilateral decreased breath sounds at  bases  abdominal: Soft, NT, ND, bowel sounds + Extremities: no edema, no cyanosis Skin: Left forearm is erythematous but much improved with an ulcer with some drainage, tenderness has improved     The results of significant diagnostics from this hospitalization (including imaging, microbiology, ancillary and laboratory) are listed below for reference.     Microbiology: No results found for this or any previous visit (from the past 240 hour(s)).   Labs: BNP (last 3 results) No results for input(s): BNP in the last 8760 hours. Basic Metabolic Panel: Recent Labs  Lab 05/11/18 1307 05/12/18 0508 05/13/18 0335  NA 138 137 137  K 3.4* 3.7 3.7  CL 105 108 107  CO2 25 21* 24  GLUCOSE 96 98 108*  BUN 11 10 9   CREATININE 0.73 0.60 0.57  CALCIUM 8.6* 8.3* 8.1*  MG  --  1.8 1.7   Liver Function Tests: Recent Labs  Lab 05/11/18 1307  AST 18  ALT 16  ALKPHOS 57  BILITOT 0.6  PROT 7.7  ALBUMIN 4.3   No results for input(s): LIPASE, AMYLASE in the last 168 hours. No results for input(s): AMMONIA in the last 168 hours. CBC: Recent Labs  Lab 05/11/18 1307 05/12/18 0508 05/13/18 0335  WBC 11.8* 9.6 6.9  NEUTROABS 9.7* 7.4 4.2  HGB 12.4 11.6* 11.2*  HCT 36.5 33.9* 33.6*  MCV 87.3 86.0 86.8  PLT 224 230 224   Cardiac Enzymes: No results for input(s): CKTOTAL, CKMB, CKMBINDEX, TROPONINI in the last 168 hours. BNP: Invalid input(s): POCBNP CBG: No results for input(s): GLUCAP in the last 168 hours. D-Dimer No results for input(s): DDIMER in the last 72 hours. Hgb A1c No results for input(s): HGBA1C in the last 72 hours. Lipid Profile No results for input(s): CHOL, HDL, LDLCALC, TRIG, CHOLHDL, LDLDIRECT in the last 72 hours. Thyroid function studies No results for input(s): TSH, T4TOTAL, T3FREE, THYROIDAB in the last 72 hours.  Invalid input(s): FREET3 Anemia work up No results for input(s): VITAMINB12, FOLATE, FERRITIN, TIBC, IRON, RETICCTPCT in the last 72  hours. Urinalysis    Component Value Date/Time   COLORURINE YELLOW 05/11/2018 1314   APPEARANCEUR HAZY (A) 05/11/2018 1314   LABSPEC 1.020 05/11/2018 1314   PHURINE 5.0 05/11/2018 1314   GLUCOSEU NEGATIVE 05/11/2018 1314   HGBUR LARGE (A) 05/11/2018 1314   BILIRUBINUR NEGATIVE 05/11/2018 1314   BILIRUBINUR neg 06/06/2013 1454   KETONESUR 5 (A) 05/11/2018 1314   PROTEINUR NEGATIVE 05/11/2018 1314   UROBILINOGEN 0.2 06/06/2013 1454   UROBILINOGEN 0.2 02/21/2013 0256   NITRITE NEGATIVE 05/11/2018 1314   LEUKOCYTESUR LARGE (A) 05/11/2018 1314   Sepsis Labs Invalid input(s): PROCALCITONIN,  WBC,  LACTICIDVEN Microbiology No results found for this or any previous visit (from the past 240 hour(s)).   Time coordinating discharge: 35 minutes  SIGNED:   Glade Lloyd, MD  [facility] 05/13/2018, 8:46  AM Pager: [phone]  If 7PM-7AM, please contact night-coverage [email or web address] Password [email or web address]

## 2018-05-13 NOTE — Care Management Note (Signed)
Case Management Note  Patient Details  Name: Marissa Shaffer MRN: [id number] Date of Birth: [birth year]  Subjective/Objective:   Cellulitis of left forearm with abscess                 Action/Plan: Provided pt with broch[facility] for [facility] on Monday to arrange initial visit. Provided pt with goodrx coupon for abx and pain medication for less than $20.00.   Expected Discharge Date:  05/13/18               Expected Discharge Plan:  Home/Self Care  In-House Referral:  NA  Discharge planning Services  CM Consult, Medication Assistance, Indigent [facility]  Post Acute Care Choice:  NA Choice offered to:  NA  DME Arranged:  N/A DME Agency:  NA  HH Arranged:  NA HH Agency:  NA  Status of Service:  Completed, signed off  If discussed at Long Length of Stay Meetings, dates discussed:    Additional Comments:  Elliot CousinShavis, Marissa Dermody Ellen, RN 05/13/2018, 5:25 PM

## 2018-05-13 NOTE — Progress Notes (Signed)
Pt was discharged home today. Instructions were reviewed with patient, and questions were answered. Pt was taken to main entrance via wheelchair by NT.  

## 2018-09-14 IMAGING — MR MR CERVICAL SPINE W/O CM
4 of 6 series · 27 of 48 positions shown · non-contrast
Comparison: None.

CLINICAL DATA: Neck pain and right shoulder pain with tingling in
the right arm and hand.

EXAM:
MRI CERVICAL SPINE WITHOUT CONTRAST
TECHNIQUE: Multiplanar, multisequence MR imaging of the cervical spine was
performed. No intravenous contrast was administered.

[Series 2: T2 · sagittal · 3.0mm · 0.41mm/px · 7 of 13 slices shown (1 of 2)]
[im 1/13]
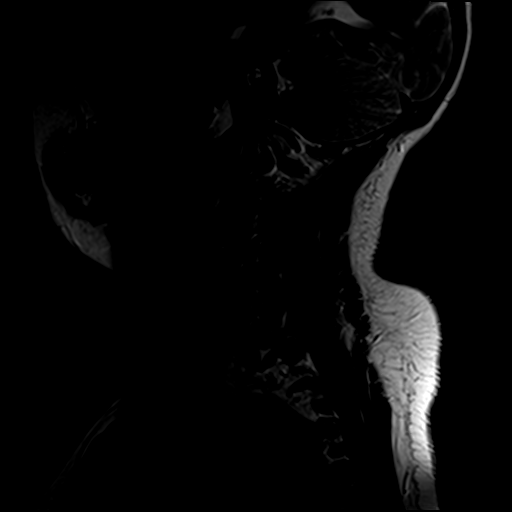
[im 3/13]
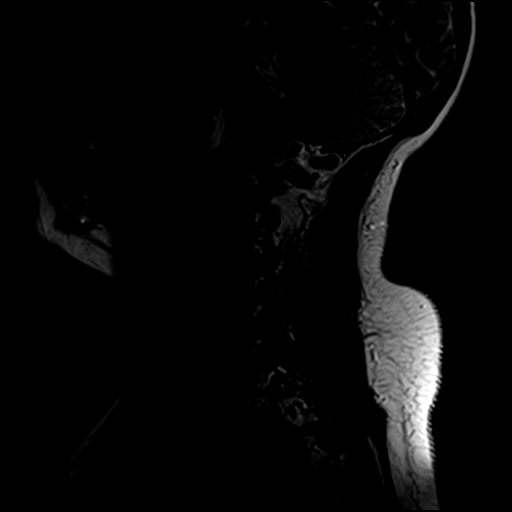
[im 5/13]
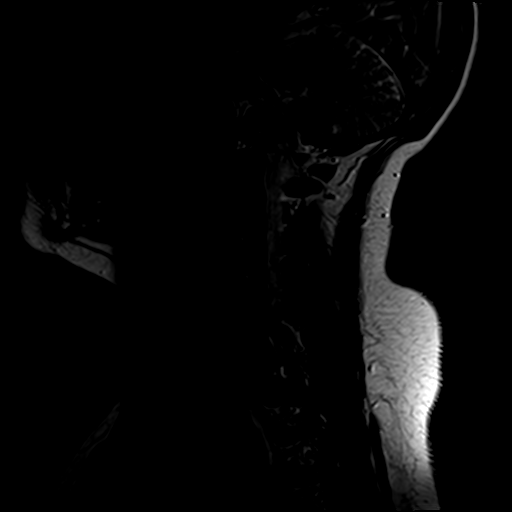
[im 7/13]
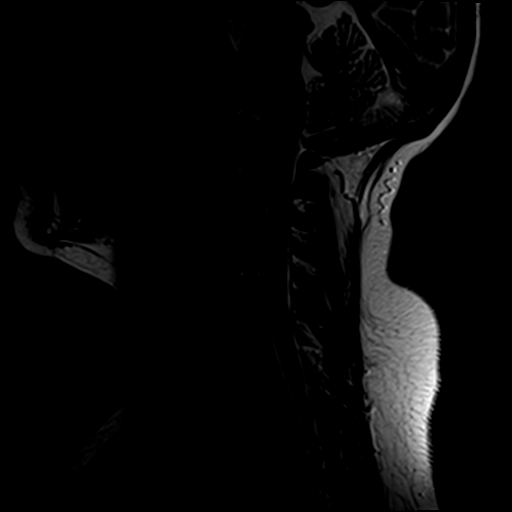
[im 9/13]
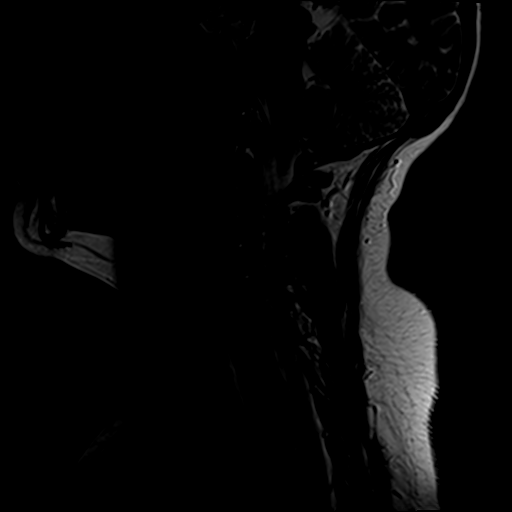
[im 11/13]
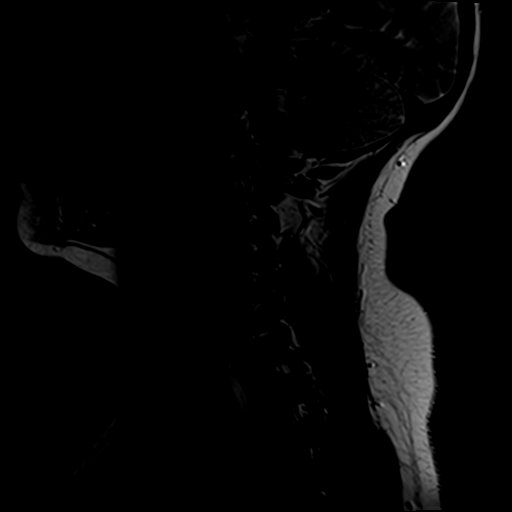
[im 13/13]
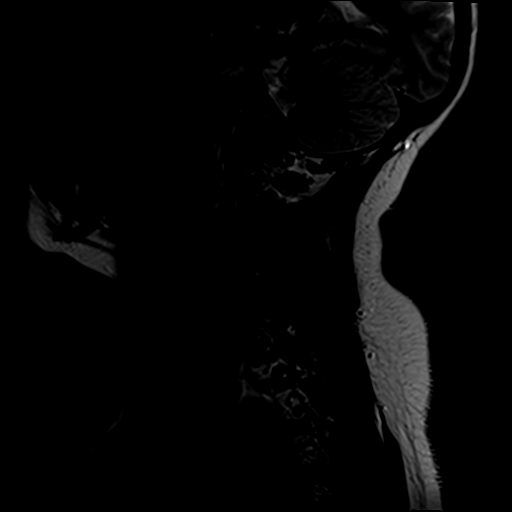

[Series 3: T1 · sagittal · 3.0mm · 0.41mm/px · 6 of 13 slices shown (1 of 2)]
[im 1/13]
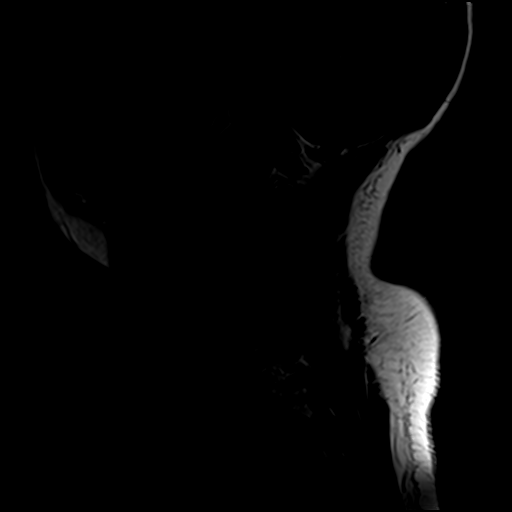
[im 3/13]
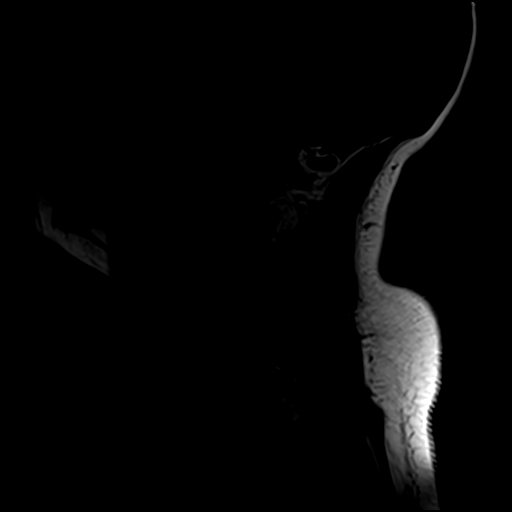
[im 5/13]
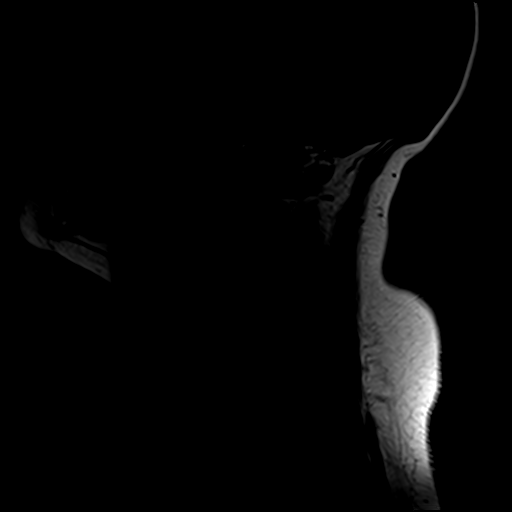
[im 7/13]
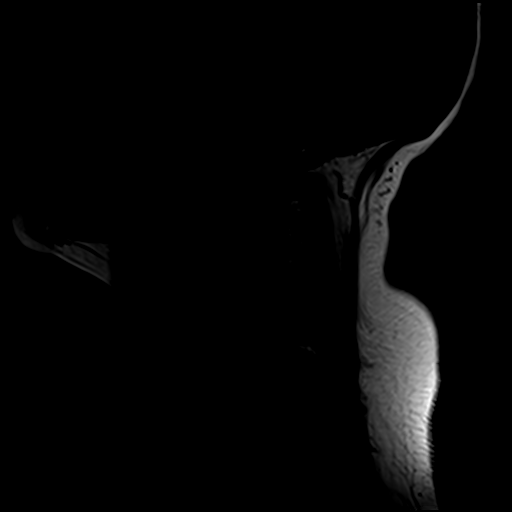
[im 9/13]
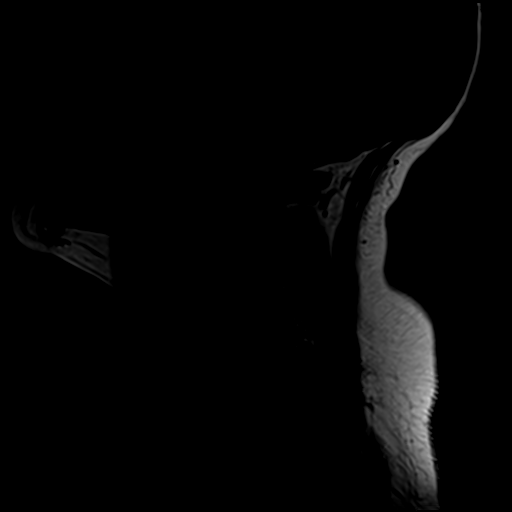
[im 11/13]
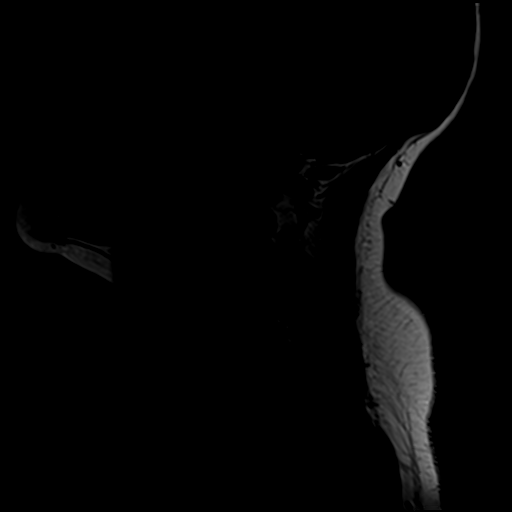

[Series 5: T1 · sagittal · 3.0mm · 0.41mm/px · 3 of 13 slices shown (2 of 2)]
[im 3/13]
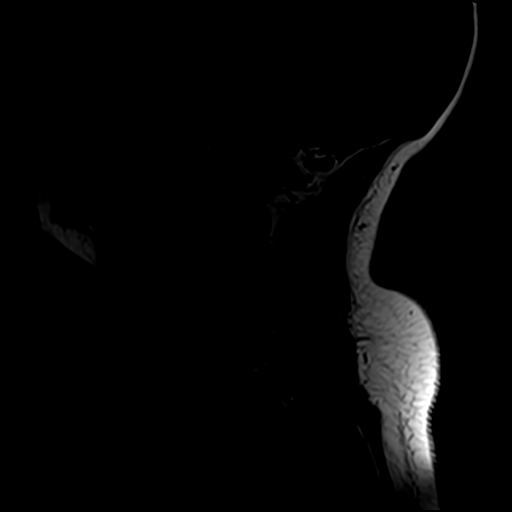
[im 8/13]
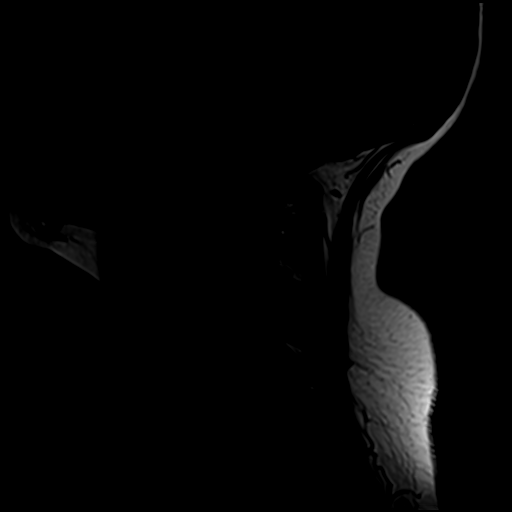
[im 13/13]
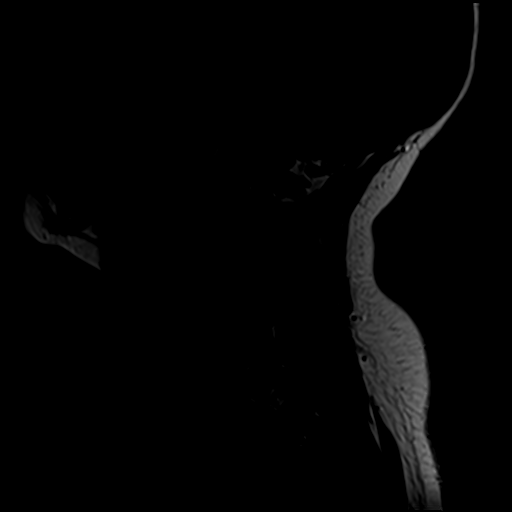

[Series 7: T2 · axial · 3.0mm · 0.70mm/px · z∈[-46,+30]mm · 11 of 22 slices shown (2 of 2)]
[im 1/22]
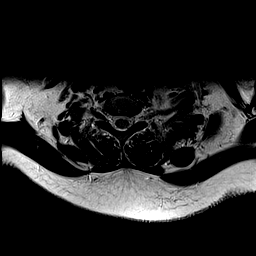
[im 3/22]
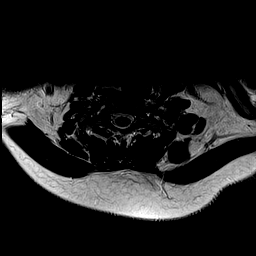
[im 5/22]
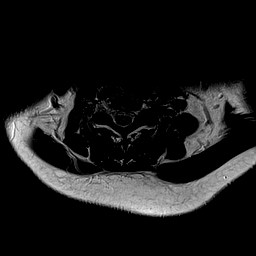
[im 7/22]
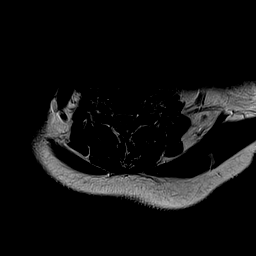
[im 9/22]
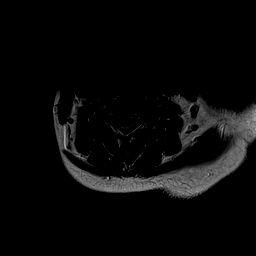
[im 11/22]
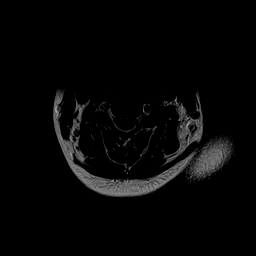
[im 13/22]
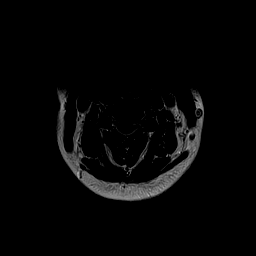
[im 15/22]
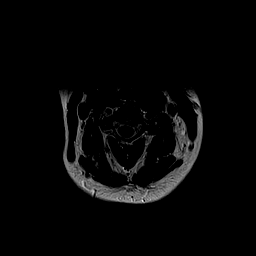
[im 17/22]
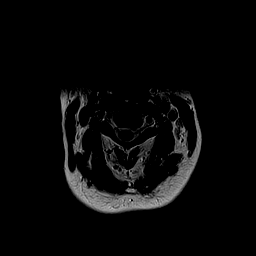
[im 19/22]
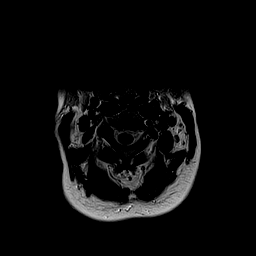
[im 22/22]
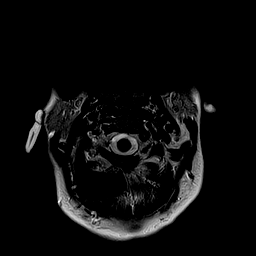

[27 of 48 positions shown; findings below may reference images not displayed]

FINDINGS: Alignment: Physiologic.

Vertebrae: No fracture, evidence of discitis, or bone lesion. No
facet arthritis in the cervical spine. No cervical or foraminal
stenoses.

Cord: Normal signal and morphology.

Posterior Fossa, vertebral arteries, paraspinal tissues: Negative.

Disc levels:

C2-3:  Normal.

C3-4: Tiny disc bulge just to the right of midline with no neural
impingement.

C4-5:  Normal.

C5-6: Tiny disc bulge with accompanying osteophyte to the left of
midline with no neural impingement.

C6-7:  Small broad-based disc bulge without neural impingement.

C7-T1:  Normal.
IMPRESSION: Minimal disc bulges at C5-6 and C6-7 without neural impingement.
Otherwise, essentially normal exam.
